# Patient Record
Sex: Female | Born: 1995 | Race: White | Hispanic: No | Marital: Single | Smoking: Never smoker
Health system: Southern US, Community
[De-identification: ages and names within clinical notes are randomized; demographics above are authoritative.]

## PROBLEM LIST (undated history)

## (undated) DIAGNOSIS — F429 Obsessive-compulsive disorder, unspecified: Secondary | ICD-10-CM

## (undated) DIAGNOSIS — F419 Anxiety disorder, unspecified: Secondary | ICD-10-CM

## (undated) DIAGNOSIS — F32A Depression, unspecified: Secondary | ICD-10-CM

## (undated) HISTORY — DX: Depression, unspecified: F32.A

## (undated) HISTORY — PX: WISDOM TOOTH EXTRACTION: SHX21

## (undated) HISTORY — DX: Obsessive-compulsive disorder, unspecified: F42.9

## (undated) HISTORY — DX: Anxiety disorder, unspecified: F41.9

---

## 2016-04-10 ENCOUNTER — Emergency Department (HOSPITAL_BASED_OUTPATIENT_CLINIC_OR_DEPARTMENT_OTHER)
Admission: EM | Admit: 2016-04-10 | Discharge: 2016-04-10 | Disposition: A | Discharge status: Home / Self Care | Payer: BLUE CROSS/BLUE SHIELD | Attending: Emergency Medicine | Admitting: Emergency Medicine

## 2016-04-10 ENCOUNTER — Encounter (HOSPITAL_BASED_OUTPATIENT_CLINIC_OR_DEPARTMENT_OTHER): Payer: Self-pay | Admitting: Emergency Medicine

## 2016-04-10 ENCOUNTER — Emergency Department (HOSPITAL_BASED_OUTPATIENT_CLINIC_OR_DEPARTMENT_OTHER): Payer: BLUE CROSS/BLUE SHIELD

## 2016-04-10 DIAGNOSIS — Y92009 Unspecified place in unspecified non-institutional (private) residence as the place of occurrence of the external cause: Secondary | ICD-10-CM | POA: Diagnosis not present

## 2016-04-10 DIAGNOSIS — X501XXA Overexertion from prolonged static or awkward postures, initial encounter: Secondary | ICD-10-CM | POA: Diagnosis not present

## 2016-04-10 DIAGNOSIS — Y939 Activity, unspecified: Secondary | ICD-10-CM | POA: Insufficient documentation

## 2016-04-10 DIAGNOSIS — Y999 Unspecified external cause status: Secondary | ICD-10-CM | POA: Insufficient documentation

## 2016-04-10 DIAGNOSIS — S93601A Unspecified sprain of right foot, initial encounter: Secondary | ICD-10-CM | POA: Diagnosis not present

## 2016-04-10 DIAGNOSIS — S99921A Unspecified injury of right foot, initial encounter: Secondary | ICD-10-CM | POA: Diagnosis present

## 2016-04-10 NOTE — ED Triage Notes (Signed)
Pt states she was getting up from sitting and her right foot was "asleep" and she stepped down, rolling her right ankle, then in response to the ankle pain, lifted her foot up into the edge of the ottoman injuring upper right foot.  Pt c/o right foot and ankle pain since.

## 2016-04-10 NOTE — Discharge Instructions (Signed)
Rest.  Ice for 20 minutes every two hours while awake for the next two days.  Elevate your foot as much as possible for the next 2 days.  Follow up with your primary doctor if you are not improving in the next week.

## 2016-04-10 NOTE — ED Notes (Signed)
ED Provider at bedside. 

## 2016-04-10 NOTE — ED Notes (Signed)
Family at bedside. 

## 2016-04-10 NOTE — ED Notes (Addendum)
Patient reports stepping down when her foot was asleep, this caused pain, in reaction she moved her foot suddenly striking the wooden edge of an ottoman, patient states she then stepped down again causing worse pain.   Bruising noted to right lateral ankle, right lateral side of foot. Patient states pain is worst at lateral side of foot. Ice applied in triage. Patient has not taken anything for pain PTA.

## 2016-04-10 NOTE — ED Provider Notes (Signed)
[facility] Note   CSN: [id number] Arrival date & time: 04/10/16  1427  By signing my name below, I, Catherine Salas, attest that this documentation has been prepared under the direction and in the presence of physician practitioner, Catherine Lyonsouglas Sundance Moise, MD. Electronically Signed: Linna Darnerussell Salas, Scribe. 04/10/2016. 3:20 PM.  History   Chief Complaint Chief Complaint  Patient presents with  . Foot Pain    The history is provided by the patient. No language interpreter was used.     HPI Comments: Catherine Salas is a 21 y.o. female who presents to the Emergency Department complaining of sudden onset, constant, right lateral foot pain beginning a few hours ago. She reports associated swelling. Pt was in a sitting position at home and states her right foot was tingling and "asleep." Pt states she stood up and inverted her right foot which caused significant pain. She reports severe pain exacerbation with bearing weight on her right foot and ambulating. Pt notes the numbness/tingling she experienced initially has now resolved. She denies focal weakness or any other associated symptoms.  History reviewed. No pertinent past medical history.  There are no active problems to display for this patient.   History reviewed. No pertinent surgical history.  OB History    No data available       Home Medications    Prior to Admission medications   Medication Sig Start Date End Date Taking? Authorizing Provider  Norgestim-Eth Estrad Triphasic (TRI-LO-MARZIA PO) Take by mouth daily.   Yes Historical Provider, MD    Family History No family history on file.  Social History Social History  Substance Use Topics  . Smoking status: Never Smoker  . Smokeless tobacco: Never Used  . Alcohol use No     Allergies   Amoxicillin   Review of Systems Review of Systems  Musculoskeletal: Positive for arthralgias and joint swelling.  Neurological: Positive for numbness  (resolved). Negative for weakness.  All other systems reviewed and are negative.  Physical Exam Updated Vital Signs BP 137/79 (BP Location: Right Arm)   Pulse (!) 102   Temp 98.4 F (36.9 C) (Oral)   Resp 20   Ht 5\' 7"  (1.702 m)   Wt 200 lb (90.7 kg)   LMP 03/20/2016 (Exact Date)   SpO2 100%   BMI 31.32 kg/m   Physical Exam  Constitutional: She is oriented to person, place, and time. She appears well-developed and well-nourished.  HENT:  Head: Normocephalic.  Eyes: EOM are normal.  Neck: Normal range of motion.  Pulmonary/Chest: Effort normal.  Abdominal: She exhibits no distension.  Musculoskeletal: Normal range of motion.  Mild swelling and TTP over the proximal dorsum of the right foot. Mild tenderness in the soft tissue below the lateral malleolus. Foot is PMS intact.  Neurological: She is alert and oriented to person, place, and time.  Psychiatric: She has a normal mood and affect.  Nursing note and vitals reviewed.  ED Treatments / Results  Labs (all labs ordered are listed, but only abnormal results are displayed) Labs Reviewed - No data to display  EKG  EKG Interpretation None       Radiology No results found.  Procedures Procedures (including critical care time)  DIAGNOSTIC STUDIES: Oxygen Saturation is 100% on RA, normal by my interpretation.    COORDINATION OF CARE: 3:24 PM Discussed treatment plan with pt at bedside and pt agreed to plan.  Medications Ordered in ED Medications - No data to display   Initial  Impression / Assessment and Plan / ED Course  I have reviewed the triage vital signs and the nursing notes.  Pertinent labs & imaging results that were available during my care of the patient were reviewed by me and considered in my medical decision making (see chart for details).  X-rays are negative. This will be treated as a sprain with an Ace bandage, rest, elevation, ice, and follow-up as needed if not improving.  Final Clinical  Impressions(s) / ED Diagnoses   Final diagnoses:  None    New Prescriptions New Prescriptions   No medications on file   I personally performed the services described in this documentation, which was scribed in my presence. The recorded information has been reviewed and is accurate.       Catherine Lyons, MD 04/10/16 [phone]

## 2016-04-10 NOTE — ED Notes (Signed)
Pt given d/c instructions as per chart. Verbalizes understanding. No questions. 

## 2019-02-08 ENCOUNTER — Ambulatory Visit: Payer: Self-pay | Attending: Pediatrics

## 2019-02-08 DIAGNOSIS — Z23 Encounter for immunization: Secondary | ICD-10-CM | POA: Insufficient documentation

## 2019-02-08 NOTE — Progress Notes (Signed)
   Covid-19 Vaccination Clinic  Name:  CARLEAN CROWL    MRN: [id number] DOB: Jul 25, 1995  02/08/2019  Ms. Klepper was observed post Covid-19 immunization for 15 minutes without incidence. She was provided with Vaccine Information Sheet and instruction to access the V-Safe system.   Ms. Casalino was instructed to call 911 with any severe reactions post vaccine: Marland Kitchen Difficulty breathing  . Swelling of your face and throat  . A fast heartbeat  . A bad rash all over your body  . Dizziness and weakness    Immunizations Administered    Name Date Dose VIS Date Route   Pfizer COVID-19 Vaccine 02/08/2019  4:15 PM 0.3 mL 12/29/2018 Intramuscular   Manufacturer: [employer], [employer]   Lot: [id number]   NDC: [address]-1

## 2019-02-28 ENCOUNTER — Ambulatory Visit: Payer: Self-pay | Attending: Internal Medicine

## 2019-02-28 DIAGNOSIS — Z23 Encounter for immunization: Secondary | ICD-10-CM | POA: Insufficient documentation

## 2019-02-28 NOTE — Progress Notes (Signed)
   Covid-19 Vaccination Clinic  Name:  MAEGAN BULLER    MRN: [id number] DOB: 04-25-1995  02/28/2019  Ms. Randleman was observed post Covid-19 immunization for 15 minutes without incidence. She was provided with Vaccine Information Sheet and instruction to access the V-Safe system.   Ms. Vukelich was instructed to call 911 with any severe reactions post vaccine: Marland Kitchen Difficulty breathing  . Swelling of your face and throat  . A fast heartbeat  . A bad rash all over your body  . Dizziness and weakness    Immunizations Administered    Name Date Dose VIS Date Route   Pfizer COVID-19 Vaccine 02/28/2019 10:55 AM 0.3 mL 12/29/2018 Intramuscular   Manufacturer: [employer], [employer]   Lot: [id number]   NDC: [address]-9

## 2020-11-11 ENCOUNTER — Ambulatory Visit: Payer: 59 | Admitting: Orthopaedic Surgery

## 2020-11-11 ENCOUNTER — Ambulatory Visit: Payer: Self-pay

## 2020-11-11 ENCOUNTER — Other Ambulatory Visit: Payer: Self-pay

## 2020-11-11 DIAGNOSIS — G8929 Other chronic pain: Secondary | ICD-10-CM | POA: Diagnosis not present

## 2020-11-11 DIAGNOSIS — M25561 Pain in right knee: Secondary | ICD-10-CM | POA: Diagnosis not present

## 2020-11-11 NOTE — Progress Notes (Signed)
Office Visit Note   Patient: Catherine Salas           Date of Birth: [birth year]           MRN: [id number] Visit Date: 11/11/2020              Requested by: Roger Kill, MD [address] [place],  [place] [address] PCP: Roger Kill, MD   Assessment & Plan: Visit Diagnoses:  1. Chronic pain of right knee     Plan: Impression is acute right knee injury.  Given prior history patellar dislocation and chondral injury and with present symptoms of locking we will need to obtain MRI to rule out structural abnormalities and to look at chondral injury.  In the meantime we will place her in a PSO brace.  Follow-up after the MRI.  Follow-Up Instructions: No follow-ups on file.   Orders:  Orders Placed This Encounter  Procedures   XR KNEE 3 VIEW RIGHT   MR Knee Right w/o contrast   No orders of the defined types were placed in this encounter.     Procedures: No procedures performed   Clinical Data: No additional findings.   Subjective: Chief Complaint  Patient presents with   Right Knee - Pain    [name] is a very pleasant 25 year old female here for evaluation of acute right knee injury from this Friday.  She planted and pivoted her right leg and felt immediate pain to the medial side of the knee.  She has had prior episode of patellar dislocation which she states on subsequent MRI showed chondral injury and meniscal tear.  She feels some slight swelling in the knee.  Feels pain on the medial side and has locking symptoms.   Review of Systems  Constitutional: Negative.   HENT: Negative.    Eyes: Negative.   Respiratory: Negative.    Cardiovascular: Negative.   Endocrine: Negative.   Musculoskeletal: Negative.   Neurological: Negative.   Hematological: Negative.   Psychiatric/Behavioral: Negative.    All other systems reviewed and are negative.   Objective: Vital Signs: There were no vitals taken for this visit.  Physical Exam Vitals and  nursing note reviewed.  Constitutional:      Appearance: She is well-developed.  Pulmonary:     Effort: Pulmonary effort is normal.  Skin:    General: Skin is warm.     Capillary Refill: Capillary refill takes less than 2 seconds.  Neurological:     Mental Status: She is alert and oriented to person, place, and time.  Psychiatric:        Behavior: Behavior normal.        Thought Content: Thought content normal.        Judgment: Judgment normal.    Ortho Exam  Right knee shows trace effusion.  Positive patellar apprehension.  MCL feels stable.  There is medial joint line tenderness.  Range of motion is limited in terms of full extension due to locking sensation.  Specialty Comments:  No specialty comments available.  Imaging: XR KNEE 3 VIEW RIGHT  Result Date: 11/11/2020 No acute or structural abnormalities    PMFS History: There are no problems to display for this patient.  No past medical history on file.  No family history on file.  No past surgical history on file. Social History   Occupational History   Not on file  Tobacco Use   Smoking status: Never   Smokeless tobacco: Never  Substance and Sexual Activity   Alcohol use: No   Drug use: Not on file   Sexual activity: Not on file

## 2020-11-17 ENCOUNTER — Telehealth: Payer: Self-pay | Admitting: Orthopaedic Surgery

## 2020-11-17 NOTE — Telephone Encounter (Signed)
Patient called. She would like another knee brace, says the one she has will not stay on her knee. Her call back number is [phone]

## 2020-11-18 ENCOUNTER — Telehealth: Payer: Self-pay | Admitting: Orthopaedic Surgery

## 2020-11-18 NOTE — Telephone Encounter (Signed)
Hinged knee brace I guess

## 2020-11-18 NOTE — Telephone Encounter (Signed)
Error

## 2020-11-18 NOTE — Telephone Encounter (Signed)
Looks like she has a PSO brace. Would you like to give her another style?

## 2020-11-18 NOTE — Telephone Encounter (Signed)
Message already routed to Dr Roda Shutters.

## 2020-11-18 NOTE — Telephone Encounter (Signed)
Pt called again

## 2020-11-19 ENCOUNTER — Other Ambulatory Visit: Payer: Self-pay

## 2020-11-19 ENCOUNTER — Ambulatory Visit (INDEPENDENT_AMBULATORY_CARE_PROVIDER_SITE_OTHER): Payer: 59

## 2020-11-19 DIAGNOSIS — M25561 Pain in right knee: Secondary | ICD-10-CM

## 2020-11-19 NOTE — Telephone Encounter (Signed)
Will come in to get fitted for brace.

## 2020-11-19 NOTE — Progress Notes (Signed)
Pt was fitted and given right hinged knee brace

## 2020-11-22 ENCOUNTER — Ambulatory Visit
Admission: RE | Admit: 2020-11-22 | Discharge: 2020-11-22 | Disposition: A | Discharge status: Home / Self Care | Payer: 59 | Source: Ambulatory Visit | Attending: Orthopaedic Surgery | Admitting: Orthopaedic Surgery

## 2020-11-22 DIAGNOSIS — G8929 Other chronic pain: Secondary | ICD-10-CM

## 2020-11-24 ENCOUNTER — Telehealth: Payer: Self-pay | Admitting: Orthopaedic Surgery

## 2020-11-24 NOTE — Telephone Encounter (Signed)
Pt called and would like to speak with someone about her MRI results. She leaves to go out of town wed and will not be able to speak with anyone.  CB [phone]

## 2020-11-24 NOTE — Telephone Encounter (Signed)
Left voice mail

## 2020-11-24 NOTE — Telephone Encounter (Signed)
Called pt 1X and left vm for pt to set appt with Dr. Roda Shutters for MRI results after 11/24/20

## 2020-11-25 ENCOUNTER — Other Ambulatory Visit: Payer: 59

## 2020-11-25 ENCOUNTER — Ambulatory Visit: Payer: 59 | Admitting: Orthopaedic Surgery

## 2020-12-02 ENCOUNTER — Telehealth: Payer: Self-pay | Admitting: Orthopaedic Surgery

## 2020-12-02 NOTE — Telephone Encounter (Signed)
Called pt left vm to set appt with DR. Xu for MRI review.

## 2020-12-15 ENCOUNTER — Telehealth: Payer: Self-pay | Admitting: Orthopaedic Surgery

## 2020-12-15 NOTE — Telephone Encounter (Signed)
Called pt left vm to call set appt with DR. Xu for MRI review.

## 2021-03-24 ENCOUNTER — Ambulatory Visit: Payer: 59 | Admitting: Physician Assistant

## 2022-05-31 ENCOUNTER — Telehealth (HOSPITAL_COMMUNITY): Payer: Self-pay | Admitting: Licensed Clinical Social Worker

## 2022-06-02 ENCOUNTER — Telehealth (HOSPITAL_COMMUNITY): Payer: Self-pay | Admitting: Licensed Clinical Social Worker

## 2022-06-03 ENCOUNTER — Other Ambulatory Visit (HOSPITAL_COMMUNITY): Payer: Self-pay

## 2022-06-07 ENCOUNTER — Other Ambulatory Visit (HOSPITAL_COMMUNITY): Payer: Self-pay | Attending: Psychiatry | Admitting: Professional

## 2022-06-07 DIAGNOSIS — F332 Major depressive disorder, recurrent severe without psychotic features: Secondary | ICD-10-CM | POA: Insufficient documentation

## 2022-06-07 DIAGNOSIS — F429 Obsessive-compulsive disorder, unspecified: Secondary | ICD-10-CM | POA: Insufficient documentation

## 2022-06-07 DIAGNOSIS — F422 Mixed obsessional thoughts and acts: Secondary | ICD-10-CM
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
## 2022-06-10 ENCOUNTER — Other Ambulatory Visit (HOSPITAL_COMMUNITY): Payer: 59

## 2022-06-10 ENCOUNTER — Other Ambulatory Visit (HOSPITAL_COMMUNITY): Payer: 59 | Attending: Psychiatry | Admitting: Licensed Clinical Social Worker

## 2022-06-10 DIAGNOSIS — F4310 Post-traumatic stress disorder, unspecified: Secondary | ICD-10-CM | POA: Insufficient documentation

## 2022-06-10 DIAGNOSIS — F422 Mixed obsessional thoughts and acts: Secondary | ICD-10-CM | POA: Insufficient documentation

## 2022-06-10 DIAGNOSIS — Z79899 Other long term (current) drug therapy: Secondary | ICD-10-CM | POA: Insufficient documentation

## 2022-06-10 DIAGNOSIS — F411 Generalized anxiety disorder: Secondary | ICD-10-CM | POA: Diagnosis not present

## 2022-06-10 DIAGNOSIS — F429 Obsessive-compulsive disorder, unspecified: Secondary | ICD-10-CM | POA: Diagnosis not present

## 2022-06-10 DIAGNOSIS — F332 Major depressive disorder, recurrent severe without psychotic features: Secondary | ICD-10-CM | POA: Insufficient documentation

## 2022-06-10 DIAGNOSIS — F909 Attention-deficit hyperactivity disorder, unspecified type: Secondary | ICD-10-CM | POA: Diagnosis not present

## 2022-06-10 DIAGNOSIS — G4700 Insomnia, unspecified: Secondary | ICD-10-CM | POA: Insufficient documentation

## 2022-06-10 DIAGNOSIS — Z62810 Personal history of physical and sexual abuse in childhood: Secondary | ICD-10-CM | POA: Diagnosis not present

## 2022-06-10 DIAGNOSIS — R4589 Other symptoms and signs involving emotional state: Secondary | ICD-10-CM | POA: Insufficient documentation

## 2022-06-10 MED ORDER — HYDROXYZINE HCL 10 MG PO TABS: 10.0000 mg | ORAL_TABLET | Freq: Three times a day (TID) | ORAL | 0 refills | Status: DC | PRN
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
## 2022-06-11 ENCOUNTER — Other Ambulatory Visit (HOSPITAL_COMMUNITY): Payer: 59 | Admitting: Licensed Clinical Social Worker

## 2022-06-11 ENCOUNTER — Encounter (HOSPITAL_COMMUNITY): Payer: Self-pay

## 2022-06-11 ENCOUNTER — Other Ambulatory Visit (HOSPITAL_COMMUNITY): Payer: 59

## 2022-06-11 DIAGNOSIS — R4589 Other symptoms and signs involving emotional state: Secondary | ICD-10-CM

## 2022-06-11 DIAGNOSIS — F411 Generalized anxiety disorder: Secondary | ICD-10-CM | POA: Diagnosis not present

## 2022-06-11 DIAGNOSIS — F332 Major depressive disorder, recurrent severe without psychotic features: Secondary | ICD-10-CM

## 2022-06-11 NOTE — Therapy (Signed)
[facility] PARTIAL HOSPITALIZATION PROGRAM [address] [place], [place], [address] Phone: [phone]   Fax:  [phone]  Occupational Therapy Treatment Virtual Visit via Video Note  I connected with Catherine Salas on 06/11/22 at  8:00 AM EDT by a video enabled telemedicine application and verified that I am speaking with the correct person using two identifiers.  Location: Patient: home Provider: office   I discussed the limitations of evaluation and management by telemedicine and the availability of in person appointments. The patient expressed understanding and agreed to proceed.    The patient was advised to call back or seek an in-person evaluation if the symptoms worsen or if the condition fails to improve as anticipated.  I provided 55 minutes of non-face-to-face time during this encounter.   Patient Details  Name: Catherine Salas MRN: [id number] Date of Birth: 01/26/1995 No data recorded  Encounter Date: 06/11/2022   OT End of Session - 06/11/22 1804     Visit Number 2    Number of Visits 20    Date for OT Re-Evaluation 07/11/22    OT Start Time 1200    OT Stop Time 1255    OT Time Calculation (min) 55 min             History reviewed. No pertinent past medical history.  History reviewed. No pertinent surgical history.  There were no vitals filed for this visit.   Subjective Assessment - 06/11/22 1803     Currently in Pain? No/denies    Pain Score 0-No pain                Group Session:  S: Feeling better today.   O: During today's OT group session, the patient participated in an educational segment about the importance of goal-setting and the application of the SMART framework to enhance daily life, particularly focusing on ADLs and iADLs. The session began with five open-ended pre-session questions that facilitated group discussion and introspection about their current relationship with goals. Following the  introduction and educational segment, participants engaged in brainstorming and group discussions to devise hypothetical SMART goals. The session concluded with five post-session questions to reinforce understanding and facilitate reflection. Throughout the session, there was a range of engagement levels noted among the participants.   A:  Patient demonstrated a high level of engagement throughout the session. They actively participated in discussions, sharing personal experiences related to goal setting and challenges faced. Patient was able to clearly articulate an understanding of the SMART framework and proposed personal SMART goals related to their own ADLs with minimal assistance. They expressed enthusiasm about applying what they learned to their daily routine and appeared motivated to make changes.   P: Continue to attend PHP OT group sessions 5x week for 4 weeks to promote daily structure, social engagement, and opportunities to develop and utilize adaptive strategies to maximize functional performance in preparation for safe transition and integration back into school, work, and the community. Plan to address topic of Routines 1 in next OT group session.                   OT Education - 06/11/22 1803     Education Details SMART Goals 4              OT Short Term Goals - 06/11/22 1754       OT SHORT TERM GOAL #1   Title Client will develop and utilize a personalized coping  toolbox containing at least five coping strategies to manage challenging situations, demonstrating their use in real-life scenarios by the end of therapy.    Time 4    Period Weeks    Status On-going    Target Date 07/11/22      OT SHORT TERM GOAL #2   Title Client will set aside designated times for self-care activities (e.g., reading, meditation, exercise) and consistently engage in these activities 3 times a week.    Status On-going      OT SHORT TERM GOAL #3   Title By discharge,  client will demonstrate the ability to adapt and modify routines when faced with unexpected events or changes, maintaining a balanced approach to daily tasks.    Status On-going      OT SHORT TERM GOAL #4   Title By the time of discharge, client will independently set, track, and make progress towards a long-term goal, demonstrating resilience in overcoming obstacles and seeking support when needed.    Status On-going                      Plan - 06/11/22 1804     Psychosocial Skills Habits;Coping Strategies;Interpersonal Interaction;Routines and Behaviors             Patient will benefit from skilled therapeutic intervention in order to improve the following deficits and impairments:       Psychosocial Skills: Habits, Coping Strategies, Interpersonal Interaction, Routines and Behaviors   Visit Diagnosis: Difficulty coping    Problem List Patient Active Problem List   Diagnosis Date Noted   MDD (major depressive disorder), recurrent episode, severe (HCC) 06/07/2022   OCD (obsessive compulsive disorder) 06/07/2022    [name], OT 06/11/2022, 6:04 PM  [name], OT   [facility] HOSPITALIZATION PROGRAM [address] [place], [place], [address] Phone: [phone]   Fax:  [phone]  Name: Catherine Salas MRN: [id number] Date of Birth: 01-11-1996

## 2022-06-11 NOTE — Therapy (Signed)
[facility] PARTIAL HOSPITALIZATION PROGRAM [address] [place], [place], [address] Phone: [phone]   Fax:  [phone]  Occupational Therapy Evaluation Virtual Visit via Video Note  I connected with Catherine Salas on 06/11/22 at  8:00 AM EDT by a video enabled telemedicine application and verified that I am speaking with the correct person using two identifiers.  Location: Patient: home Provider: office   I discussed the limitations of evaluation and management by telemedicine and the availability of in person appointments. The patient expressed understanding and agreed to proceed.    The patient was advised to call back or seek an in-person evaluation if the symptoms worsen or if the condition fails to improve as anticipated.  I provided 85 minutes of non-face-to-face time during this encounter.   Patient Details  Name: Catherine Salas MRN: [id number] Date of Birth: 1995/04/16 No data recorded  Encounter Date: 06/10/2022   OT End of Session - 06/11/22 1752     Visit Number 1    Number of Visits 20    Date for OT Re-Evaluation 07/11/22    OT Start Time 0930    OT Stop Time 1255   eval: 30; Tx: 55   OT Time Calculation (min) 205 min    Activity Tolerance Patient tolerated treatment well             History reviewed. No pertinent past medical history.  History reviewed. No pertinent surgical history.  There were no vitals filed for this visit.   Subjective Assessment - 06/11/22 1749     Subjective  I am hoping to learn now coping skills while I am here.    Pertinent History MDD, OCD    Limitations roles, interests, goals, communication and coping skills    Currently in Pain? No/denies    Pain Score 0-No pain                   OT Assessment  Diagnosis: MDD, OCD Past medical history/referral information: MDD Living situation: parents ADLs: independent Work: non-profit Leisure: inhibited  Social support:   intact Struggles: coping, communication, interests, roles, habits, routines and goals OT goal:  Improve deficit areas to allow for independence in daily occupational performance.   OCAIRS Mental Health Interview Summary of Client Scores:  Facilitates participation in occupation Allows participation in occupation Inhibits participation in occupation Restricts participation in occupation Comments:  Roles   X    Habits    X   Personal Causation   X    Values X      Interests  X     Skills   X    Short-Term Goals  X     Long-term Goals   X    Interpretation of Past Experiences   X    Physical Environment   X    Social Environment  X     Readiness for Change   X      Need for Occupational Therapy:  4 Shows positive occupational participation, no need for OT.   3 Need for minimal intervention/consultative participation   2 Need for OT intervention indicated to restore/improve participation   1 Need for extensive OT intervention indicated to improve participation.  Referral for follow up services also recommended.   Assessment:  Patient demonstrates behavior that INHIBITS participation in occupation.  Patient will benefit from occupational therapy intervention in order to improve time management, financial management, stress management, job readiness skills, social skills, and  health management skills in preparation to return to full time community living and to be a productive community member.    Plan:  Patient will participate in skilled occupational therapy sessions individually or in a group setting to improve coping skills, psychosocial skills, and emotional skills required to return to prior level of function. Treatment will be 4-5 times per week for 4 weeks.      Group Session:  O: During today's OT group session, the patient participated in an educational segment about the importance of goal-setting and the application of the SMART framework to enhance daily life, particularly  focusing on ADLs and iADLs. The session began with five open-ended pre-session questions that facilitated group discussion and introspection about their current relationship with goals. Following the introduction and educational segment, participants engaged in brainstorming and group discussions to devise hypothetical SMART goals. The session concluded with five post-session questions to reinforce understanding and facilitate reflection. Throughout the session, there was a range of engagement levels noted among the participants.   A:  Patient demonstrated a high level of engagement throughout the session. They actively participated in discussions, sharing personal experiences related to goal setting and challenges faced. Patient was able to clearly articulate an understanding of the SMART framework and proposed personal SMART goals related to their own ADLs with minimal assistance. They expressed enthusiasm about applying what they learned to their daily routine and appeared motivated to make changes.              OT Education - 06/11/22 1751     Education Details OCAIRS / SMART Goals 3    Person(s) Educated Patient    Methods Explanation;Verbal cues    Comprehension Verbalized understanding              OT Short Term Goals - 06/11/22 1754       OT SHORT TERM GOAL #1   Title Client will develop and utilize a personalized coping toolbox containing at least five coping strategies to manage challenging situations, demonstrating their use in real-life scenarios by the end of therapy.    Time 4    Period Weeks    Status On-going    Target Date 07/11/22      OT SHORT TERM GOAL #2   Title Client will set aside designated times for self-care activities (e.g., reading, meditation, exercise) and consistently engage in these activities 3 times a week.    Status On-going      OT SHORT TERM GOAL #3   Title By discharge, client will demonstrate the ability to adapt and modify routines  when faced with unexpected events or changes, maintaining a balanced approach to daily tasks.    Status On-going      OT SHORT TERM GOAL #4   Title By the time of discharge, client will independently set, track, and make progress towards a long-term goal, demonstrating resilience in overcoming obstacles and seeking support when needed.    Status On-going                      Plan - 06/11/22 1753     Clinical Impression Statement Pt presents with deficits across multiple psychosocial domains that inhibit occupational performance.    OT Occupational Profile and History Problem Focused Assessment - Including review of records relating to presenting problem    Occupational performance deficits (Please refer to evaluation for details): Rest and Sleep;Work;Leisure;Social Participation    Psychosocial Skills Habits;Coping Strategies;Interpersonal Interaction;Routines and Behaviors  Rehab Potential Good    Clinical Decision Making Limited treatment options, no task modification necessary    Comorbidities Affecting Occupational Performance: None    Modification or Assistance to Complete Evaluation  No modification of tasks or assist necessary to complete eval    OT Frequency 5x / week    OT Duration 4 weeks    OT Treatment/Interventions Psychosocial skills training;Coping strategies training             Patient will benefit from skilled therapeutic intervention in order to improve the following deficits and impairments:       Psychosocial Skills: Habits, Coping Strategies, Interpersonal Interaction, Routines and Behaviors   Visit Diagnosis: Difficulty coping  Severe episode of recurrent major depressive disorder, without psychotic features (HCC)  Mixed obsessional thoughts and acts    Problem List Patient Active Problem List   Diagnosis Date Noted   MDD (major depressive disorder), recurrent episode, severe (HCC) 06/07/2022   OCD (obsessive compulsive disorder)  06/07/2022    [name], OT 06/11/2022, 5:56 PM  [name], OT   [facility] [place] [facility] HOSPITALIZATION PROGRAM [address] [place], [place], [address] Phone: [phone]   Fax:  [phone]  Name: Catherine Salas MRN: [id number] Date of Birth: 09/06/1995

## 2022-06-15 ENCOUNTER — Other Ambulatory Visit (HOSPITAL_COMMUNITY): Payer: 59

## 2022-06-15 ENCOUNTER — Other Ambulatory Visit (HOSPITAL_COMMUNITY): Payer: 59 | Admitting: Licensed Clinical Social Worker

## 2022-06-15 ENCOUNTER — Encounter (HOSPITAL_COMMUNITY): Payer: Self-pay | Admitting: Family

## 2022-06-15 ENCOUNTER — Encounter (HOSPITAL_COMMUNITY): Payer: Self-pay

## 2022-06-15 DIAGNOSIS — R4589 Other symptoms and signs involving emotional state: Secondary | ICD-10-CM

## 2022-06-15 DIAGNOSIS — F332 Major depressive disorder, recurrent severe without psychotic features: Secondary | ICD-10-CM

## 2022-06-15 DIAGNOSIS — F411 Generalized anxiety disorder: Secondary | ICD-10-CM | POA: Diagnosis not present

## 2022-06-15 DIAGNOSIS — F422 Mixed obsessional thoughts and acts: Secondary | ICD-10-CM

## 2022-06-15 MED ORDER — FLUOXETINE HCL 10 MG PO CAPS: 30.0000 mg | ORAL_CAPSULE | Freq: Every day | ORAL | 0 refills | Status: DC

## 2022-06-15 NOTE — Progress Notes (Signed)
Virtual Visit via Video Note  I connected with Catherine Salas on 06/15/22 at  9:00 AM EDT by a video enabled telemedicine application and verified that I am speaking with the correct person using two identifiers.  Location: Patient: Home Provider: Office   I discussed the limitations of evaluation and management by telemedicine and the availability of in person appointments. The patient expressed understanding and agreed to proceed.    I discussed the assessment and treatment plan with the patient. The patient was provided an opportunity to ask questions and all were answered. The patient agreed with the plan and demonstrated an understanding of the instructions.   The patient was advised to call back or seek an in-person evaluation if the symptoms worsen or if the condition fails to improve as anticipated.  I provided 15 minutes of non-face-to-face time during this encounter.   Catherine Rack, NP   [facility] MD/PA/NP OP Progress Note  06/15/2022 11:28 AM Catherine Salas  MRN:  [id number]  Chief Complaint: " I am doing okay today, I does have acute questions related to my medications."  Evaluation: Catherine Salas was seen and evaluated via video assessment.  She is awake, alert and oriented x 3.  Denying suicidal or homicidal ideations.  Denies auditory  or visual hallucinations.  She reports worsening depression and anxiety.  States she is diagnosed with obsessive-compulsive disorder which is worse since becoming difficult for her to function.  States this is causing multiple stressors throughout her life over the past 6 months.  States she has not been able to cope well.    States feeling overwhelmed at times.  Patient reports she was recently initiated on Prozac 20 mg.  Discussed titrating to 30 mg during this assessment.  She was receptive to plan.  Does report concerns related to appetite however she attributes her decreased appetite to her ADHD medication.  States she has to force  herself to eat as she is currently prescribed Ritalin.  She denied illicit drug use or substance abuse history.  States she is resting well throughout the night.  Patient has concerns related to taking hydroxyzine for anxiety.  Patient encouraged to take medication while attending partial hospitalization programming to report any adverse reactions.  She was receptive to plan.  Support, encouragement  and reassurance was provided.  HPI: per admission assessment noted: " Tayva Marich is a 27 yo patient with a PPH of depression, anxiety, ADHD, PTSD, OCD."   Visit Diagnosis:    ICD-10-CM   1. Severe episode of recurrent major depressive disorder, without psychotic features (HCC)  F33.2     2. Mixed obsessional thoughts and acts  F42.2       Past Psychiatric History:   Past Medical History: No past medical history on file. No past surgical history on file.  Family Psychiatric History:   Family History: No family history on file.  Social History:  Social History   Socioeconomic History   Marital status: Single    Spouse name: Not on file   Number of children: Not on file   Years of education: Not on file   Highest education level: Not on file  Occupational History   Not on file  Tobacco Use   Smoking status: Never   Smokeless tobacco: Never  Substance and Sexual Activity   Alcohol use: No   Drug use: Not on file   Sexual activity: Not on file  Other Topics Concern   Not on file  Social  History Narrative   Not on file   Social Determinants of Health   Financial Resource Strain: Not on file  Food Insecurity: Not on file  Transportation Needs: Not on file  Physical Activity: Not on file  Stress: Not on file  Social Connections: Not on file    Allergies:  Allergies  Allergen Reactions   Amoxicillin Hives    Metabolic Disorder Labs: No results found for: "HGBA1C", "MPG" No results found for: "PROLACTIN" No results found for: "CHOL", "TRIG", "HDL", "CHOLHDL",  "VLDL", "LDLCALC" No results found for: "TSH"  Therapeutic Level Labs: No results found for: "LITHIUM" No results found for: "VALPROATE" No results found for: "CBMZ"  Current Medications: Current Outpatient Medications  Medication Sig Dispense Refill   hydrOXYzine (ATARAX) 10 MG tablet Take 1 tablet (10 mg total) by mouth 3 (three) times daily as needed. 90 tablet 0   Norgestim-Eth Estrad Triphasic (TRI-LO-MARZIA PO) Take by mouth daily.     No current facility-administered medications for this visit.     Musculoskeletal: Strength & Muscle Tone: within normal limits Gait & Station: normal Patient leans: N/A  Psychiatric Specialty Exam: Review of Systems  Constitutional: Negative.   Psychiatric/Behavioral:  Positive for decreased concentration. The patient is nervous/anxious.   All other systems reviewed and are negative.   There were no vitals taken for this visit.There is no height or weight on file to calculate BMI.  General Appearance: Casual  Eye Contact:  Good  Speech:  Clear and Coherent  Volume:  Normal  Mood:  Anxious and Depressed  Affect:  Congruent  Thought Process:  Coherent  Orientation:  Full (Time, Place, and Person)  Thought Content: Logical   Suicidal Thoughts:  No  Homicidal Thoughts:  No  Memory:  Immediate;   Fair Recent;   Good  Judgement:  Good  Insight:  Good  Psychomotor Activity:  Normal  Concentration:  Concentration: Good  Recall:  Good  Fund of Knowledge: Good  Language: Good  Akathisia:  No  Handed:  Right  AIMS (if indicated): done  Assets:  Communication Skills Desire for Improvement  ADL's:  Intact  Cognition: WNL  Sleep:  Good   Screenings: PHQ2-9    Flowsheet Row Counselor from 06/07/2022 in [facility] PART[facility] HOSPITALIZATION PROGRAM  PHQ-2 Total Score 4  PHQ-9 Total Score 22      Flowsheet Row Counselor from 06/07/2022 in BEHAVIORAL HEALTH PARTIAL HOSPITALIZATION PROGRAM  C-SSRS RISK CATEGORY No Risk         Assessment and Plan:  Continue partial hospitalization programming -Increase Prozac 20 mg to 30 mg daily- resent medication to [facility] per patient request on 06/17/2022 -Continue hydroxyzine 25 mg p.o. as needed  Collaboration of Care: Collaboration of Care: Medication Management AEB increase Prozac from 20 mg to 30 mg daily  Patient/Guardian was advised Release of Information must be obtained prior to any record release in order to collaborate their care with an outside provider. Patient/Guardian was advised if they have not already done so to contact the registration department to sign all necessary forms in order for [place] to release information regarding their care.   Consent: Patient/Guardian gives verbal consent for treatment and assignment of benefits for services provided during this visit. Patient/Guardian expressed understanding and agreed to proceed.    Catherine Rack, NP 06/15/2022, 11:28 AM

## 2022-06-15 NOTE — Therapy (Signed)
[facility] PARTIAL HOSPITALIZATION PROGRAM [address] [place], [place], [address] Phone: [phone]   Fax:  [phone]  Occupational Therapy Treatment Virtual Visit via Video Note  I connected with [name] on 06/15/22 at  8:00 AM EDT by a video enabled telemedicine application and verified that I am speaking with the correct person using two identifiers.  Location: Patient: home Provider: office   I discussed the limitations of evaluation and management by telemedicine and the availability of in person appointments. The patient expressed understanding and agreed to proceed.    The patient was advised to call back or seek an in-person evaluation if the symptoms worsen or if the condition fails to improve as anticipated.  I provided 55 minutes of non-face-to-face time during this encounter.   Patient Details  Name: Catherine Salas MRN: [id number] Date of Birth: 1995-06-06 No data recorded  Encounter Date: 06/15/2022   OT End of Session - 06/15/22 1753     Visit Number 3    Number of Visits 20    Date for OT Re-Evaluation 07/11/22    OT Start Time 1200    OT Stop Time 1255    OT Time Calculation (min) 55 min             History reviewed. No pertinent past medical history.  History reviewed. No pertinent surgical history.  There were no vitals filed for this visit.   Subjective Assessment - 06/15/22 1753     Currently in Pain? No/denies    Pain Score 0-No pain                Group Session:  S: Doing okay today.   O: The objective of the telehealth group therapy session was to discuss the significance of routines in promoting mental health and wellbeing. The OT aimed to explore the power of routines in providing structure, stability, and predictability in individuals' lives, as well as their role in establishing healthy habits, reducing stress and anxiety, managing time effectively, achieving goals, and fostering  a sense of community and social connectedness.   Participants were guided to identify areas where routines could improve their mental health and were provided with tips for establishing and maintaining healthy routines. The session concluded by emphasizing the potential of routines to enhance overall quality of life.  Homework Assignment:  As part of the session, participants were assigned a homework task to reflect on their current routines and select one area of their lives where they could establish a new routine to improve their mental health and wellbeing. They were instructed to start small and implement the routine gradually, while seeking support from friends, family, or mental health professionals as needed. The participants were asked to report their progress in the next therapy session, focusing on the benefits and challenges encountered during the implementation of their chosen routine.   A: During the telehealth group therapy session, the patient actively participated in the discussion on the importance of routines in promoting mental health and wellbeing. The patient demonstrated a good understanding of the power of routines in providing structure, stability, and predictability in their life. They were able to identify areas in their life where routines could contribute to improving their overall mental health.    P: Continue to attend PHP OT group sessions 5x week for 4 weeks to promote daily structure, social engagement, and opportunities to develop and utilize adaptive strategies to maximize functional performance in preparation for safe transition  and integration back into school, work, and the community. Plan to address topic of pt 2 in next OT group session.                   OT Education - 06/15/22 1753     Education Details Routines 1              OT Short Term Goals - 06/11/22 1754       OT SHORT TERM GOAL #1   Title Client will develop and utilize a  personalized coping toolbox containing at least five coping strategies to manage challenging situations, demonstrating their use in real-life scenarios by the end of therapy.    Time 4    Period Weeks    Status On-going    Target Date 07/11/22      OT SHORT TERM GOAL #2   Title Client will set aside designated times for self-care activities (e.g., reading, meditation, exercise) and consistently engage in these activities 3 times a week.    Status On-going      OT SHORT TERM GOAL #3   Title By discharge, client will demonstrate the ability to adapt and modify routines when faced with unexpected events or changes, maintaining a balanced approach to daily tasks.    Status On-going      OT SHORT TERM GOAL #4   Title By the time of discharge, client will independently set, track, and make progress towards a long-term goal, demonstrating resilience in overcoming obstacles and seeking support when needed.    Status On-going                      Plan - 06/15/22 1753     Psychosocial Skills Habits;Coping Strategies;Interpersonal Interaction;Routines and Behaviors             Patient will benefit from skilled therapeutic intervention in order to improve the following deficits and impairments:       Psychosocial Skills: Habits, Coping Strategies, Interpersonal Interaction, Routines and Behaviors   Visit Diagnosis: Difficulty coping    Problem List Patient Active Problem List   Diagnosis Date Noted   MDD (major depressive disorder), recurrent episode, severe (HCC) 06/07/2022   OCD (obsessive compulsive disorder) 06/07/2022    [name], OT 06/15/2022, 5:54 PM  [name], OT   [facility] HOSPITALIZATION PROGRAM [address] [place], [place], [address] Phone: [phone]   Fax:  [phone]  Name: Catherine Salas MRN: [id number] Date of Birth: December 02, 1995

## 2022-06-16 ENCOUNTER — Other Ambulatory Visit (HOSPITAL_COMMUNITY): Payer: 59 | Admitting: Licensed Clinical Social Worker

## 2022-06-16 ENCOUNTER — Encounter (HOSPITAL_COMMUNITY): Payer: Self-pay

## 2022-06-16 ENCOUNTER — Other Ambulatory Visit (HOSPITAL_COMMUNITY): Payer: 59

## 2022-06-16 DIAGNOSIS — F411 Generalized anxiety disorder: Secondary | ICD-10-CM | POA: Diagnosis not present

## 2022-06-16 DIAGNOSIS — F422 Mixed obsessional thoughts and acts: Secondary | ICD-10-CM

## 2022-06-16 DIAGNOSIS — R4589 Other symptoms and signs involving emotional state: Secondary | ICD-10-CM

## 2022-06-16 DIAGNOSIS — F332 Major depressive disorder, recurrent severe without psychotic features: Secondary | ICD-10-CM

## 2022-06-16 NOTE — Progress Notes (Signed)
Spoke with patient via Teams video call, used 2 identifiers to correctly identify patient. States that she was referred to [facility] by her therapist. Has a lot of work stress working for [employer] and also moved from living with a boyfriend earlier this year. Her parents came and got her. Taking Prozac and states that it generally made her tired but lately its been having the opposite effect. Asking if she can take magnesium citrate with her medications. Message sent to MD for discussion. On scale 1-10 as 10 being worst she rates depression at 6 and anxiety at 6. Denies SI/HI or AV hallucinations.PHQ9=17. No side effects from medications. No issues or complaints.

## 2022-06-16 NOTE — Psych (Signed)
Active     OP Depression     LTG: Reduce frequency, intensity, and duration of depression symptoms so that daily functioning is improved (Initial)     Start:  06/10/22    Expected End:  09/10/22         LTG: Increase coping skills to manage depression and improve ability to perform daily activities (Initial)     Start:  06/10/22    Expected End:  09/10/22         STG: Nicole Cella will attend at least 80% of scheduled PHP sessions    (Initial)     Start:  06/10/22    Expected End:  09/10/22         STG: Nicole Cella will complete at least 80% of assigned homework  (Initial)     Start:  06/10/22    Expected End:  09/10/22         STG: Nicole Cella will identify cognitive patterns and beliefs that support depression (Initial)     Start:  06/10/22    Expected End:  09/10/22           Tema verbally agrees to treatment plan.

## 2022-06-17 ENCOUNTER — Other Ambulatory Visit (HOSPITAL_COMMUNITY): Payer: 59 | Admitting: Licensed Clinical Social Worker

## 2022-06-17 ENCOUNTER — Encounter (HOSPITAL_COMMUNITY): Payer: Self-pay

## 2022-06-17 ENCOUNTER — Other Ambulatory Visit (HOSPITAL_COMMUNITY): Payer: 59

## 2022-06-17 DIAGNOSIS — F422 Mixed obsessional thoughts and acts: Secondary | ICD-10-CM

## 2022-06-17 DIAGNOSIS — F411 Generalized anxiety disorder: Secondary | ICD-10-CM | POA: Diagnosis not present

## 2022-06-17 DIAGNOSIS — R4589 Other symptoms and signs involving emotional state: Secondary | ICD-10-CM

## 2022-06-17 DIAGNOSIS — F332 Major depressive disorder, recurrent severe without psychotic features: Secondary | ICD-10-CM

## 2022-06-17 MED ORDER — FLUOXETINE HCL 10 MG PO CAPS: 30.0000 mg | ORAL_CAPSULE | Freq: Every day | ORAL | 0 refills | Status: DC

## 2022-06-17 NOTE — Therapy (Signed)
[facility] PARTIAL HOSPITALIZATION PROGRAM [address] [place], [place], [address] Phone: [phone]   Fax:  [phone]  Occupational Therapy Treatment Virtual Visit via Video Note  I connected with [name] on 06/17/22 at  8:00 AM EDT by a video enabled telemedicine application and verified that I am speaking with the correct person using two identifiers.  Location: Patient: home Provider: office   I discussed the limitations of evaluation and management by telemedicine and the availability of in person appointments. The patient expressed understanding and agreed to proceed.    The patient was advised to call back or seek an in-person evaluation if the symptoms worsen or if the condition fails to improve as anticipated.  I provided 55 minutes of non-face-to-face time during this encounter.  Patient Details  Name: Catherine Salas MRN: [id number] Date of Birth: 1995-04-12 No data recorded  Encounter Date: 06/16/2022   OT End of Session - 06/17/22 1726     Visit Number 4    Number of Visits 20    Date for OT Re-Evaluation 07/11/22    OT Start Time 1200    OT Stop Time 1255    OT Time Calculation (min) 55 min             Past Medical History:  Diagnosis Date   Anxiety    Depression    Obsessive-compulsive disorder     Past Surgical History:  Procedure Laterality Date   WISDOM TOOTH EXTRACTION Bilateral     There were no vitals filed for this visit.   Subjective Assessment - 06/17/22 1722     Currently in Pain? No/denies    Pain Score 0-No pain                  Group Session:  S: Doing better today I believe.   O: The objective of the telehealth group therapy session was to discuss the significance of routines in promoting mental health and wellbeing. The OT aimed to explore the power of routines in providing structure, stability, and predictability in individuals' lives, as well as their role in  establishing healthy habits, reducing stress and anxiety, managing time effectively, achieving goals, and fostering a sense of community and social connectedness.   Participants were guided to identify areas where routines could improve their mental health and were provided with tips for establishing and maintaining healthy routines. The session concluded by emphasizing the potential of routines to enhance overall quality of life.  Homework Assignment:  As part of the session, participants were assigned a homework task to reflect on their current routines and select one area of their lives where they could establish a new routine to improve their mental health and wellbeing. They were instructed to start small and implement the routine gradually, while seeking support from friends, family, or mental health professionals as needed. The participants were asked to report their progress in the next therapy session, focusing on the benefits and challenges encountered during the implementation of their chosen routine.   A: During the telehealth group therapy session, the patient actively participated in the discussion on the importance of routines in promoting mental health and wellbeing. The patient demonstrated a good understanding of the power of routines in providing structure, stability, and predictability in their life. They were able to identify areas in their life where routines could contribute to improving their overall mental health.  The patient showed motivation and willingness to reflect on their current  habits and behaviors, recognizing the need for positive changes. They actively engaged in the session, sharing personal experiences and challenges related to establishing and maintaining healthy routines. The patient expressed a desire to reduce stress and anxiety, manage their time more effectively, and achieve their goals through the implementation of routines.    P: Continue to attend PHP OT  group sessions 5x week for 4 weeks to promote daily structure, social engagement, and opportunities to develop and utilize adaptive strategies to maximize functional performance in preparation for safe transition and integration back into school, work, and the community. Plan to address topic of pt 3 in next OT group session.                 OT Education - 06/17/22 1722     Education Details Routines 2              OT Short Term Goals - 06/11/22 1754       OT SHORT TERM GOAL #1   Title Client will develop and utilize a personalized coping toolbox containing at least five coping strategies to manage challenging situations, demonstrating their use in real-life scenarios by the end of therapy.    Time 4    Period Weeks    Status On-going    Target Date 07/11/22      OT SHORT TERM GOAL #2   Title Client will set aside designated times for self-care activities (e.g., reading, meditation, exercise) and consistently engage in these activities 3 times a week.    Status On-going      OT SHORT TERM GOAL #3   Title By discharge, client will demonstrate the ability to adapt and modify routines when faced with unexpected events or changes, maintaining a balanced approach to daily tasks.    Status On-going      OT SHORT TERM GOAL #4   Title By the time of discharge, client will independently set, track, and make progress towards a long-term goal, demonstrating resilience in overcoming obstacles and seeking support when needed.    Status On-going                      Plan - 06/17/22 1726     Psychosocial Skills Habits;Coping Strategies;Interpersonal Interaction;Routines and Behaviors             Patient will benefit from skilled therapeutic intervention in order to improve the following deficits and impairments:       Psychosocial Skills: Habits, Coping Strategies, Interpersonal Interaction, Routines and Behaviors   Visit Diagnosis: Difficulty  coping    Problem List Patient Active Problem List   Diagnosis Date Noted   MDD (major depressive disorder), recurrent episode, severe (HCC) 06/07/2022   OCD (obsessive compulsive disorder) 06/07/2022    [name], OT 06/17/2022, 5:26 PM [name], OT  [facility] HOSPITALIZATION PROGRAM [address] [place], [place], [address] Phone: [phone]   Fax:  [phone]  Name: LOUVINIA DEFRANCISCO MRN: [id number] Date of Birth: 1995/10/21

## 2022-06-17 NOTE — Therapy (Signed)
[name] PARTIAL HOSPITALIZATION PROGRAM [address] [place], [place], [address] Phone: [phone]   Fax:  [phone]  Occupational Therapy Treatment Virtual Visit via Video Note  I connected with Catherine Salas on 06/17/22 at  8:00 AM EDT by a video enabled telemedicine application and verified that I am speaking with the correct person using two identifiers.  Location: Patient: home Provider: office   I discussed the limitations of evaluation and management by telemedicine and the availability of in person appointments. The patient expressed understanding and agreed to proceed.    The patient was advised to call back or seek an in-person evaluation if the symptoms worsen or if the condition fails to improve as anticipated.  I provided 55 minutes of non-face-to-face time during this encounter.   Patient Details  Name: Catherine Salas MRN: [id number] Date of Birth: 1995/07/29 No data recorded  Encounter Date: 06/17/2022   OT End of Session - 06/17/22 1802     Visit Number 5    Number of Visits 20    Date for OT Re-Evaluation 07/11/22    OT Start Time 1200    OT Stop Time 1255    OT Time Calculation (min) 55 min             Past Medical History:  Diagnosis Date   Anxiety    Depression    Obsessive-compulsive disorder     Past Surgical History:  Procedure Laterality Date   WISDOM TOOTH EXTRACTION Bilateral     There were no vitals filed for this visit.   Subjective Assessment - 06/17/22 1801     Currently in Pain? No/denies    Pain Score 0-No pain               Group Session:  S: Doing better today.   O: The objective of the telehealth group therapy session was to discuss the significance of routines in promoting mental health and wellbeing. The OT aimed to explore the power of routines in providing structure, stability, and predictability in individuals' lives, as well as their role in establishing healthy  habits, reducing stress and anxiety, managing time effectively, achieving goals, and fostering a sense of community and social connectedness.   Participants were guided to identify areas where routines could improve their mental health and were provided with tips for establishing and maintaining healthy routines. The session concluded by emphasizing the potential of routines to enhance overall quality of life.  Homework Assignment:  As part of the session, participants were assigned a homework task to reflect on their current routines and select one area of their lives where they could establish a new routine to improve their mental health and wellbeing. They were instructed to start small and implement the routine gradually, while seeking support from friends, family, or mental health professionals as needed. The participants were asked to report their progress in the next therapy session, focusing on the benefits and challenges encountered during the implementation of their chosen routine.   A: The patient showed motivation and willingness to reflect on their current habits and behaviors, recognizing the need for positive changes. They actively engaged in the session, sharing personal experiences and challenges related to establishing and maintaining healthy routines. The patient expressed a desire to reduce stress and anxiety, manage their time more effectively, and achieve their goals through the implementation of routines.    P: Continue to attend PHP OT group sessions 5x week for 4 weeks to  promote daily structure, social engagement, and opportunities to develop and utilize adaptive strategies to maximize functional performance in preparation for safe transition and integration back into school, work, and the community. Plan to address topic of pt 4 in next OT group session.                      OT Education - 06/17/22 1801     Education Details Routines 3               OT Short Term Goals - 06/11/22 1754       OT SHORT TERM GOAL #1   Title Client will develop and utilize a personalized coping toolbox containing at least five coping strategies to manage challenging situations, demonstrating their use in real-life scenarios by the end of therapy.    Time 4    Period Weeks    Status On-going    Target Date 07/11/22      OT SHORT TERM GOAL #2   Title Client will set aside designated times for self-care activities (e.g., reading, meditation, exercise) and consistently engage in these activities 3 times a week.    Status On-going      OT SHORT TERM GOAL #3   Title By discharge, client will demonstrate the ability to adapt and modify routines when faced with unexpected events or changes, maintaining a balanced approach to daily tasks.    Status On-going      OT SHORT TERM GOAL #4   Title By the time of discharge, client will independently set, track, and make progress towards a long-term goal, demonstrating resilience in overcoming obstacles and seeking support when needed.    Status On-going                      Plan - 06/17/22 1802     Psychosocial Skills Habits;Coping Strategies;Interpersonal Interaction;Routines and Behaviors             Patient will benefit from skilled therapeutic intervention in order to improve the following deficits and impairments:       Psychosocial Skills: Habits, Coping Strategies, Interpersonal Interaction, Routines and Behaviors   Visit Diagnosis: Difficulty coping    Problem List Patient Active Problem List   Diagnosis Date Noted   MDD (major depressive disorder), recurrent episode, severe (HCC) 06/07/2022   OCD (obsessive compulsive disorder) 06/07/2022    [name], OT 06/17/2022, 6:02 PM  [name], OT   [facility] HOSPITALIZATION PROGRAM [address] [place], [place], [address] Phone: [phone]   Fax:  [phone]  Name:  Catherine Salas MRN: [id number] Date of Birth: 10/24/1995

## 2022-06-18 ENCOUNTER — Other Ambulatory Visit (HOSPITAL_COMMUNITY): Payer: 59

## 2022-06-18 DIAGNOSIS — F422 Mixed obsessional thoughts and acts: Secondary | ICD-10-CM

## 2022-06-18 DIAGNOSIS — F332 Major depressive disorder, recurrent severe without psychotic features: Secondary | ICD-10-CM

## 2022-06-18 DIAGNOSIS — F411 Generalized anxiety disorder: Secondary | ICD-10-CM

## 2022-06-18 DIAGNOSIS — R4589 Other symptoms and signs involving emotional state: Secondary | ICD-10-CM

## 2022-06-18 NOTE — Psych (Signed)
Virtual Visit via Video Note  I connected with Catherine Salas on 06/10/22 at  9:00 AM EDT by a video enabled telemedicine application and verified that I am speaking with the correct person using two identifiers.  Location: Patient: patient home Provider: clinical home office   I discussed the limitations of evaluation and management by telemedicine and the availability of in person appointments. The patient expressed understanding and agreed to proceed.  I discussed the assessment and treatment plan with the patient. The patient was provided an opportunity to ask questions and all were answered. The patient agreed with the plan and demonstrated an understanding of the instructions.   The patient was advised to call back or seek an in-person evaluation if the symptoms worsen or if the condition fails to improve as anticipated.  Pt was provided 240 minutes of non-face-to-face time during this encounter.   Donia Guiles, LCSW   [facility] PHP THERAPIST PROGRESS NOTE  Catherine Salas [id number]  Session Time: 9:00 - 10:00  Participation Level: Active  Behavioral Response: CasualAlertDepressed  Type of Therapy: Group Therapy  Treatment Goals addressed: Coping  Progress Towards Goals: Initial  Interventions: CBT, DBT, Supportive, and Reframing  Summary: Catherine Salas is a 27 y.o. female who presents with depression and anxiety symptoms.  Clinician led check-in regarding current stressors and situation, and review of patient completed daily inventory. Clinician utilized active listening and empathetic response and validated patient emotions. Clinician facilitated processing group on pertinent issues.?    Therapist Response: Patient arrived within time allowed. Patient rates her mood at a 3.8 on a scale of 1-10 with 10 being best. Pt states she feels "not as bad as I have been, but not great." Pt states she slept 6 hours and ate 3x. Pt reports she had a procedure two days ago  and spent yesterday recovering. Pt reports her siblings have been in town which has elevated her mood because she likes spending time with them. Pt reports difficulty feeling her feelings. Patient able to process. Patient engaged in discussion.          Session Time: 10:00 am - 11:00 am   Participation Level: Active   Behavioral Response: CasualAlertDepressed   Type of Therapy: Group Therapy   Treatment Goals addressed: Coping   Progress Towards Goals: Progressing   Interventions: CBT, DBT, Solution Focused, Strength-based, Supportive, and Reframing   Therapist Response: Cln led discussion on deep breathing and its therapeutic benefits, using DBT TIPP skills to inform discussion. Group practiced how to breathe from their diaphragms to ensure therapeutic quality and different ways to keep track of regulating breaths.    Therapist Response: Pt engaged in discussion and practice.          Session Time: 11:00 -12:00   Participation Level: Active   Behavioral Response: CasualAlertDepressed   Type of Therapy: Group Therapy   Treatment Goals addressed: Coping   Progress Towards Goals: Progressing   Interventions: CBT, DBT, Solution Focused, Strength-based, Supportive, and Reframing   Summary: Cln continued topic of DBT distress tolerance skills. Cln introduced Self-Soothe skills. Group discussed ways they can utilize the five senses to soothe themselves when struggling.   Therapist Response:  Pt engaged in discussion and identifies way to utilize the skill.        Session Time: 12:00 -1:00   Participation Level: Active   Behavioral Response: CasualAlertDepressed   Type of Therapy: Group therapy, Occupational Therapy   Treatment Goals addressed: Coping   Progress Towards  Goals: Progressing   Interventions: Supportive; Psychoeducation   Summary: 12:00 - 12:50: Occupational Therapy group led by cln E. Hollan. 12:50 - 1:00 Clinician assessed for immediate needs,  medication compliance and efficacy, and safety concerns.   Therapist Response: 12:00 - 12:50: See OT note 12:50 - 1:00 pm: At check-out, patient reports no immediate concerns. Patient demonstrates progress as evidenced by participating in first group session. Patient denies SI/HI/self-harm thoughts at the end of group.    Suicidal/Homicidal: Nowithout intent/plan  Plan: Pt will continue in PHP while working to decrease depression and anxiety symptoms, increase emotion regulation, and increase ability to manage symptoms in a healthy manner.   Collaboration of Care: Medication Management AEB J. McQuilla  Patient/Guardian was advised Release of Information must be obtained prior to any record release in order to collaborate their care with an outside provider. Patient/Guardian was advised if they have not already done so to contact the registration department to sign all necessary forms in order for [place] to release information regarding their care.   Consent: Patient/Guardian gives verbal consent for treatment and assignment of benefits for services provided during this visit. Patient/Guardian expressed understanding and agreed to proceed.   Diagnosis: Severe episode of recurrent major depressive disorder, without psychotic features (HCC) [F33.2]    1. Severe episode of recurrent major depressive disorder, without psychotic features (HCC)   2. GAD (generalized anxiety disorder)       Donia Guiles, LCSW

## 2022-06-18 NOTE — Psych (Signed)
Virtual Visit via Video Note  I connected with [name] on 06/11/22 at  9:00 AM EDT by a video enabled telemedicine application and verified that I am speaking with the correct person using two identifiers.  Location: Patient: patient home Provider: clinical home office   I discussed the limitations of evaluation and management by telemedicine and the availability of in person appointments. The patient expressed understanding and agreed to proceed.  I discussed the assessment and treatment plan with the patient. The patient was provided an opportunity to ask questions and all were answered. The patient agreed with the plan and demonstrated an understanding of the instructions.   The patient was advised to call back or seek an in-person evaluation if the symptoms worsen or if the condition fails to improve as anticipated.  Pt was provided 240 minutes of non-face-to-face time during this encounter.   Donia Guiles, LCSW   [facility] PHP THERAPIST PROGRESS NOTE  KAILYNE WOOLFORK [id number]  Session Time: 9:00 - 10:00  Participation Level: Active  Behavioral Response: CasualAlertDepressed  Type of Therapy: Group Therapy  Treatment Goals addressed: Coping  Progress Towards Goals: Initial  Interventions: CBT, DBT, Supportive, and Reframing  Summary: DEMOND SAHM is a 27 y.o. female who presents with depression and anxiety symptoms.  Clinician led check-in regarding current stressors and situation, and review of patient completed daily inventory. Clinician utilized active listening and empathetic response and validated patient emotions. Clinician facilitated processing group on pertinent issues.?    Therapist Response: Patient arrived within time allowed. Patient rates her mood at a 5 on a scale of 1-10 with 10 being best. Pt states she feels "a little bit better than yesterday." Pt states she slept 6 hours and ate 3x. Pt reports yesterday was difficult because she ran  into a person who is triggering to her. Pt reports spiraling. Pt reports managing by eating a doughnut and crying. Pt reports using distraction to help manage. Patient able to process. Patient engaged in discussion.          Session Time: 10:00 am - 11:00 am   Participation Level: Active   Behavioral Response: CasualAlertDepressed   Type of Therapy: Group Therapy   Treatment Goals addressed: Coping   Progress Towards Goals: Progressing   Interventions: CBT, DBT, Solution Focused, Strength-based, Supportive, and Reframing   Therapist Response:  Cln led discussion on anxious behaviors. Group members shared patterns of anxiety they experience. Cln encouraged pt's to increase insight into the roots of their anxious behaviors to be able to address triggers.      Therapist Response: Pt engaged in discussion and reports increased insight into triggers.          Session Time: 11:00 -12:00   Participation Level: Active   Behavioral Response: CasualAlertDepressed   Type of Therapy: Group Therapy   Treatment Goals addressed: Coping   Progress Towards Goals: Progressing   Interventions: CBT, DBT, Solution Focused, Strength-based, Supportive, and Reframing   Summary: Cln led discussion on ways to manage stressors and feelings over the weekend. Group members  brainstormed things to do over the weekend for multiple levels of energy, access, and moods. Cln reviewed crisis services should they be needed and provided pt's with the text crisis line, mobile crisis, national suicide hotline, [facility] 24/7 line, and information on [facility].     Therapist Response:  Pt engaged in discussion and is able to identify 3 ideas of what to do over the weekend to keep  their mind engaged.        Session Time: 12:00 -1:00   Participation Level: Active   Behavioral Response: CasualAlertDepressed   Type of Therapy: Group therapy, Occupational Therapy   Treatment Goals addressed: Coping    Progress Towards Goals: Progressing   Interventions: Supportive; Psychoeducation   Summary: 12:00 - 12:50: Occupational Therapy group led by cln E. Hollan. 12:50 - 1:00 Clinician assessed for immediate needs, medication compliance and efficacy, and safety concerns.   Therapist Response: 12:00 - 12:50: See OT note 12:50 - 1:00 pm: At check-out, patient reports no immediate concerns. Patient demonstrates progress as evidenced by continued engagement and responsiveness to treatment. Patient denies SI/HI/self-harm thoughts at the end of group.    Suicidal/Homicidal: Nowithout intent/plan  Plan: Pt will continue in PHP while working to decrease depression and anxiety symptoms, increase emotion regulation, and increase ability to manage symptoms in a healthy manner.   Collaboration of Care: Medication Management [employer]  Patient/Guardian was advised Release of Information must be obtained prior to any record release in order to collaborate their care with an outside provider. Patient/Guardian was advised if they have not already done so to contact the registration department to sign all necessary forms in order for [place] to release information regarding their care.   Consent: Patient/Guardian gives verbal consent for treatment and assignment of benefits for services provided during this visit. Patient/Guardian expressed understanding and agreed to proceed.   Diagnosis: Severe episode of recurrent major depressive disorder, without psychotic features (HCC) [F33.2]    1. Severe episode of recurrent major depressive disorder, without psychotic features (HCC)   2. GAD (generalized anxiety disorder)       Donia Guiles, LCSW

## 2022-06-19 NOTE — Psych (Signed)
Virtual Visit via Video Note  I connected with Catherine Salas on 06/15/22 at  9:00 AM EDT by a video enabled telemedicine application and verified that I am speaking with the correct person using two identifiers.  Location: Patient: patient home Provider: clinical home office   I discussed the limitations of evaluation and management by telemedicine and the availability of in person appointments. The patient expressed understanding and agreed to proceed.  I discussed the assessment and treatment plan with the patient. The patient was provided an opportunity to ask questions and all were answered. The patient agreed with the plan and demonstrated an understanding of the instructions.   The patient was advised to call back or seek an in-person evaluation if the symptoms worsen or if the condition fails to improve as anticipated.  Pt was provided 240 minutes of non-face-to-face time during this encounter.   Donia Guiles, LCSW   [facility] PHP THERAPIST PROGRESS NOTE  Catherine Salas [id number]  Session Time: 9:00 - 10:00  Participation Level: Active  Behavioral Response: CasualAlertDepressed  Type of Therapy: Group Therapy  Treatment Goals addressed: Coping  Progress Towards Goals: Progressing  Interventions: CBT, DBT, Supportive, and Reframing  Summary: Catherine Salas is a 27 y.o. female who presents with depression and anxiety symptoms.  Clinician led check-in regarding current stressors and situation, and review of patient completed daily inventory. Clinician utilized active listening and empathetic response and validated patient emotions. Clinician facilitated processing group on pertinent issues.?    Therapist Response: Patient arrived within time allowed. Patient rates her mood at a 4.5 on a scale of 1-10 with 10 being best. Pt states she feels "a good tired." Pt states she slept 7 hours and ate 3x. Pt reports she was social three days in a row and feels drained. Pt  shares it was good to see her friends and enjoyed herself however struggled with negative self-talk during and after the hangouts. Pt states resting outside of the social times due to her low energy. Patient able to process. Patient engaged in discussion.          Session Time: 10:00 am - 11:00 am   Participation Level: Active   Behavioral Response: CasualAlertDepressed   Type of Therapy: Group Therapy   Treatment Goals addressed: Coping   Progress Towards Goals: Progressing   Interventions: CBT, DBT, Solution Focused, Strength-based, Supportive, and Reframing   Therapist Response:  Cln led discussion on reverting to old behaviors. Group members discussed ways in which they feel they have reverted currently or in the past. Group members report fear of reverting to old behaviors and they struggle to manage that fear. Cln informed discussion with CBT thought challenging and DBT distress tolerance skills.    Therapist Response: Pt engaged in discussion.         Session Time: 11:00 -12:00   Participation Level: Active   Behavioral Response: CasualAlertDepressed   Type of Therapy: Group Therapy   Treatment Goals addressed: Coping   Progress Towards Goals: Progressing   Interventions: CBT, DBT, Solution Focused, Strength-based, Supportive, and Reframing   Summary: Cln introduced topic of stress management and the model of the "4 A's of stress management:" avoid, alter, accept, and adapt. Group members worked through Engineer, structural and discussed barriers to utilizing the 4 A's for stressors.    Therapist Response:  Pt engaged in discussion and reports understanding of how to utilize the 4 A's.        Session Time: 12:00 -1:00  Participation Level: Active   Behavioral Response: CasualAlertDepressed   Type of Therapy: Group therapy, Occupational Therapy   Treatment Goals addressed: Coping   Progress Towards Goals: Progressing   Interventions: Supportive; Psychoeducation    Summary: 12:00 - 12:50: Occupational Therapy group led by cln E. Hollan. 12:50 - 1:00 Clinician assessed for immediate needs, medication compliance and efficacy, and safety concerns.   Therapist Response: 12:00 - 12:50: See OT note 12:50 - 1:00 pm: At check-out, patient reports no immediate concerns. Patient demonstrates progress as evidenced by continued engagement and responsiveness to treatment. Patient denies SI/HI/self-harm thoughts at the end of group.    Suicidal/Homicidal: Nowithout intent/plan  Plan: Pt will continue in PHP while working to decrease depression and anxiety symptoms, increase emotion regulation, and increase ability to manage symptoms in a healthy manner.   Collaboration of Care: Medication Management [employer]Quilla  Patient/Guardian was advised Release of Information must be obtained prior to any record release in order to collaborate their care with an outside provider. Patient/Guardian was advised if they have not already done so to contact the registration department to sign all necessary forms in order for [place] to release information regarding their care.   Consent: Patient/Guardian gives verbal consent for treatment and assignment of benefits for services provided during this visit. Patient/Guardian expressed understanding and agreed to proceed.   Diagnosis: Severe episode of recurrent major depressive disorder, without psychotic features (HCC) [F33.2]    1. Severe episode of recurrent major depressive disorder, without psychotic features (HCC)   2. Mixed obsessional thoughts and acts       [employer], [address]

## 2022-06-19 NOTE — Psych (Signed)
Virtual Visit via Video Note  I connected with Catherine Salas on 06/16/22 at  9:00 AM EDT by a video enabled telemedicine application and verified that I am speaking with the correct person using two identifiers.  Location: Patient: patient home Provider: clinical home office   I discussed the limitations of evaluation and management by telemedicine and the availability of in person appointments. The patient expressed understanding and agreed to proceed.  I discussed the assessment and treatment plan with the patient. The patient was provided an opportunity to ask questions and all were answered. The patient agreed with the plan and demonstrated an understanding of the instructions.   The patient was advised to call back or seek an in-person evaluation if the symptoms worsen or if the condition fails to improve as anticipated.  Pt was provided 240 minutes of non-face-to-face time during this encounter.   Donia Guiles, LCSW   [facility] PHP THERAPIST PROGRESS NOTE  Catherine Salas [id number]  Session Time: 9:00 - 10:00  Participation Level: Active  Behavioral Response: CasualAlertDepressed  Type of Therapy: Group Therapy  Treatment Goals addressed: Coping  Progress Towards Goals: Progressing  Interventions: CBT, DBT, Supportive, and Reframing  Summary: Catherine Salas is a 27 y.o. female who presents with depression and anxiety symptoms.  Clinician led check-in regarding current stressors and situation, and review of patient completed daily inventory. Clinician utilized active listening and empathetic response and validated patient emotions. Clinician facilitated processing group on pertinent issues.?    Therapist Response: Patient arrived within time allowed. Patient rates her mood at a 4 on a scale of 1-10 with 10 being best. Pt states she feels "the same." Pt states she slept 6 hours and ate 3x. Pt reports she felt overwhelmed yesterday due to the weight of care giving  for her mom. Pt reports her obsessive thoughts were increased yesterday and she took her PRN medications and utilized distraction to manage. Pt reports desire to not be knocked down as hard by her symptoms.  Patient able to process. Patient engaged in discussion.          Session Time: 10:00 am - 11:00 am   Participation Level: Active   Behavioral Response: CasualAlertDepressed   Type of Therapy: Group Therapy   Treatment Goals addressed: Coping   Progress Towards Goals: Progressing   Interventions: CBT, DBT, Solution Focused, Strength-based, Supportive, and Reframing   Therapist Response: Cln led processing group for pt's current struggles. Group members shared stressors and provided support and feedback. Cln brought in topics of boundaries, healthy relationships, and unhealthy thought processes to inform discussion.    Therapist Response: Pt able to process and provide support to group.          Session Time: 11:00 -12:00   Participation Level: Active   Behavioral Response: CasualAlertDepressed   Type of Therapy: Group Therapy, Spiritual Care   Treatment Goals addressed: Coping   Progress Towards Goals: Progressing   Interventions: Supportive, Education   Summary:  Catherine Salas, Chaplain, led group.   Therapist Response: Pt participated        Session Time: 12:00 -1:00   Participation Level: Active   Behavioral Response: CasualAlertDepressed   Type of Therapy: Group therapy, Occupational Therapy   Treatment Goals addressed: Coping   Progress Towards Goals: Progressing   Interventions: Supportive; Psychoeducation   Summary: 12:00 - 12:50: Occupational Therapy group led by cln E. Hollan. 12:50 - 1:00 Clinician assessed for immediate needs, medication compliance and efficacy, and safety  concerns.   Therapist Response: 12:00 - 12:50: See OT note 12:50 - 1:00 pm: At check-out, patient reports no immediate concerns. Patient demonstrates progress as evidenced  by continued engagement and responsiveness to treatment. Patient denies SI/HI/self-harm thoughts at the end of group.    Suicidal/Homicidal: Nowithout intent/plan  Plan: Pt will continue in PHP while working to decrease depression and anxiety symptoms, increase emotion regulation, and increase ability to manage symptoms in a healthy manner.   Collaboration of Care: Medication Management [employer]  Patient/Guardian was advised Release of Information must be obtained prior to any record release in order to collaborate their care with an outside provider. Patient/Guardian was advised if they have not already done so to contact the registration department to sign all necessary forms in order for [place] to release information regarding their care.   Consent: Patient/Guardian gives verbal consent for treatment and assignment of benefits for services provided during this visit. Patient/Guardian expressed understanding and agreed to proceed.   Diagnosis: Severe episode of recurrent major depressive disorder, without psychotic features (HCC) [F33.2]    1. Severe episode of recurrent major depressive disorder, without psychotic features (HCC)   2. GAD (generalized anxiety disorder)   3. Mixed obsessional thoughts and acts       [employer], [address]

## 2022-06-20 ENCOUNTER — Encounter (HOSPITAL_COMMUNITY): Payer: Self-pay

## 2022-06-20 NOTE — Therapy (Signed)
[facility] PARTIAL HOSPITALIZATION PROGRAM [address] [place], [place], [address] Phone: [phone]   Fax:  [phone]  Occupational Therapy Treatment Virtual Visit via Video Note  I connected with [name] on 06/20/22 at  8:00 AM EDT by a video enabled telemedicine application and verified that I am speaking with the correct person using two identifiers.  Location: Patient: home Provider: office   I discussed the limitations of evaluation and management by telemedicine and the availability of in person appointments. The patient expressed understanding and agreed to proceed.    The patient was advised to call back or seek an in-person evaluation if the symptoms worsen or if the condition fails to improve as anticipated.  I provided 55 minutes of non-face-to-face time during this encounter.   Patient Details  Name: Catherine Salas MRN: [id number] Date of Birth: 03-26-1995 No data recorded  Encounter Date: 06/18/2022   OT End of Session - 06/20/22 0850     Visit Number 6    Number of Visits 20    Date for OT Re-Evaluation 07/11/22    OT Start Time 1200    OT Stop Time 1255    OT Time Calculation (min) 55 min             Past Medical History:  Diagnosis Date   Anxiety    Depression    Obsessive-compulsive disorder     Past Surgical History:  Procedure Laterality Date   WISDOM TOOTH EXTRACTION Bilateral     There were no vitals filed for this visit.   Subjective Assessment - 06/20/22 0850     Currently in Pain? No/denies    Pain Score 0-No pain                Group Session:  S: Feeling better today.   O: The objective of this presentation is to provide a comprehensive understanding of the concept of "motivation" and its role in human behavior and well-being. The content covers various theories of motivation, including intrinsic and extrinsic motivators, and explores the psychological mechanisms that drive  individuals to achieve goals, overcome obstacles, and make decisions. By diving into real-world applications, the presentation aims to offer actionable strategies for enhancing motivation in different life domains, such as work, relationships, and personal growth. Utilizing a multi-disciplinary approach, this presentation integrates insights from psychology, neuroscience, and behavioral economics to present a holistic view of motivation. The objective is not only to educate the audience about the complexities and driving forces behind motivation but also to equip them with practical tools and techniques to improve their own motivation levels. By the end of the presentation, attendees should have a well-rounded understanding of what motivates human actions and how to harness this knowledge for personal and professional betterment.   A: The patient demonstrates a high level of engagement during the session, actively participating in discussions about motivation theories and their applicability to their own life. They show keen interest in learning new strategies to improve their motivation and even offer examples from their own experiences that align with the theories presented. Their level of self-awareness and willingness to invest in self-improvement suggest that they are well-positioned to benefit from the practical tools and techniques discussed. The patient's ability to articulate their goals and challenges further supports the likelihood of successfully implementing the strategies presented.    P: Continue to attend PHP OT group sessions 5x week for 4 weeks to promote daily structure, social engagement, and  opportunities to develop and utilize adaptive strategies to maximize functional performance in preparation for safe transition and integration back into school, work, and the community. Plan to address topic of pt 2 in next OT group session.                   OT Education - 06/20/22  0850     Education Details Motivation 1              OT Short Term Goals - 06/11/22 1754       OT SHORT TERM GOAL #1   Title Client will develop and utilize a personalized coping toolbox containing at least five coping strategies to manage challenging situations, demonstrating their use in real-life scenarios by the end of therapy.    Time 4    Period Weeks    Status On-going    Target Date 07/11/22      OT SHORT TERM GOAL #2   Title Client will set aside designated times for self-care activities (e.g., reading, meditation, exercise) and consistently engage in these activities 3 times a week.    Status On-going      OT SHORT TERM GOAL #3   Title By discharge, client will demonstrate the ability to adapt and modify routines when faced with unexpected events or changes, maintaining a balanced approach to daily tasks.    Status On-going      OT SHORT TERM GOAL #4   Title By the time of discharge, client will independently set, track, and make progress towards a long-term goal, demonstrating resilience in overcoming obstacles and seeking support when needed.    Status On-going                      Plan - 06/20/22 0850     Psychosocial Skills Habits;Coping Strategies;Interpersonal Interaction;Routines and Behaviors             Patient will benefit from skilled therapeutic intervention in order to improve the following deficits and impairments:       Psychosocial Skills: Habits, Coping Strategies, Interpersonal Interaction, Routines and Behaviors   Visit Diagnosis: Difficulty coping    Problem List Patient Active Problem List   Diagnosis Date Noted   MDD (major depressive disorder), recurrent episode, severe (HCC) 06/07/2022   OCD (obsessive compulsive disorder) 06/07/2022    [name], OT 06/20/2022, 8:51 AM  [name], OT   [facility] PROGRAM [address] [place], [place],  [address] Phone: [phone]   Fax:  [phone]  Name: ZIHAM KEEBLE MRN: [id number] Date of Birth: 1995/05/08

## 2022-06-20 NOTE — Psych (Signed)
Virtual Visit via Video Note  I connected with Catherine Salas on 06/18/22 at  9:00 AM EDT by a video enabled telemedicine application and verified that I am speaking with the correct person using two identifiers.  Location: Patient: patient home Provider: clinical home office   I discussed the limitations of evaluation and management by telemedicine and the availability of in person appointments. The patient expressed understanding and agreed to proceed.  I discussed the assessment and treatment plan with the patient. The patient was provided an opportunity to ask questions and all were answered. The patient agreed with the plan and demonstrated an understanding of the instructions.   The patient was advised to call back or seek an in-person evaluation if the symptoms worsen or if the condition fails to improve as anticipated.  Pt was provided 120 minutes of non-face-to-face time during this encounter.   Donia Guiles, LCSW   [facility] PHP THERAPIST PROGRESS NOTE  YAKELYN FITZER [id number]  Session Time: 9:00 - 10:00  Participation Level: Active  Behavioral Response: CasualAlertDepressed  Type of Therapy: Group Therapy  Treatment Goals addressed: Coping  Progress Towards Goals: Progressing  Interventions: CBT, DBT, Supportive, and Reframing  Summary: Catherine Salas is a 27 y.o. female who presents with depression and anxiety symptoms.  Clinician led check-in regarding current stressors and situation, and review of patient completed daily inventory. Clinician utilized active listening and empathetic response and validated patient emotions. Clinician facilitated processing group on pertinent issues.?    Therapist Response: Patient arrived within time allowed. Patient rates her mood at a 5 on a scale of 1-10 with 10 being best. Pt states she feels "better than yesterday." Pt states she slept 7.5 hours and ate 3x. Pt reports she went to [employer] yesterday to get out of  the house and she was happy for a change of scenery. Pt reports sleeping deeper. Pt states she took a new increased dose of Prozac this morning and is feeling very drowsy. Patient able to process. Patient engaged in discussion.            Session Time: 10:00 am - 11:00 am   Participation Level: Active   Behavioral Response: CasualAlertDepressed   Type of Therapy: Group Therapy   Treatment Goals addressed: Coping   Progress Towards Goals: Progressing   Interventions: CBT, DBT, Solution Focused, Strength-based, Supportive, and Reframing   Therapist Response: Cln led discussion on trust and how to establish healthy trust. Group discussed common missteps such as disclosing personal information too soon, ignoring behaviors being displayed, inaccurately defining the relationship, and not giving people credit. Cln utilized CBT thought challenging principles to encourage pt's to rely on "evidence" versus feelings. Group members shared struggles they experience with trust.   Therapist Response: Pt engaged in discussion and is able to identify  areas of improvement in their trust process.     **Pt chose to leave group at 11 stating she could not stay awake. Pt denies SI before leaving group.    Suicidal/Homicidal: Nowithout intent/plan  Plan: Pt will continue in PHP while working to decrease depression and anxiety symptoms, increase emotion regulation, and increase ability to manage symptoms in a healthy manner.   Collaboration of Care: Medication Management [employer]  Patient/Guardian was advised Release of Information must be obtained prior to any record release in order to collaborate their care with an outside provider. Patient/Guardian was advised if they have not already done so to contact the registration department to  sign all necessary forms in order for [place] to release information regarding their care.   Consent: Patient/Guardian gives verbal consent for treatment and  assignment of benefits for services provided during this visit. Patient/Guardian expressed understanding and agreed to proceed.   Diagnosis: Severe episode of recurrent major depressive disorder, without psychotic features (HCC) [F33.2]    1. Severe episode of recurrent major depressive disorder, without psychotic features (HCC)   2. GAD (generalized anxiety disorder)   3. Mixed obsessional thoughts and acts       [employer], [address]

## 2022-06-20 NOTE — Psych (Signed)
Virtual Visit via Video Note  I connected with Catherine Salas on 06/17/22 at  9:00 AM EDT by a video enabled telemedicine application and verified that I am speaking with the correct person using two identifiers.  Location: Patient: patient home Provider: clinical home office   I discussed the limitations of evaluation and management by telemedicine and the availability of in person appointments. The patient expressed understanding and agreed to proceed.  I discussed the assessment and treatment plan with the patient. The patient was provided an opportunity to ask questions and all were answered. The patient agreed with the plan and demonstrated an understanding of the instructions.   The patient was advised to call back or seek an in-person evaluation if the symptoms worsen or if the condition fails to improve as anticipated.  Pt was provided 240 minutes of non-face-to-face time during this encounter.   Donia Guiles, LCSW   [facility] PHP THERAPIST PROGRESS NOTE  Catherine Salas [id number]  Session Time: 9:00 - 10:00  Participation Level: Active  Behavioral Response: CasualAlertDepressed  Type of Therapy: Group Therapy  Treatment Goals addressed: Coping  Progress Towards Goals: Progressing  Interventions: CBT, DBT, Supportive, and Reframing  Summary: Catherine Salas is a 27 y.o. female who presents with depression and anxiety symptoms.  Clinician led check-in regarding current stressors and situation, and review of patient completed daily inventory. Clinician utilized active listening and empathetic response and validated patient emotions. Clinician facilitated processing group on pertinent issues.?    Therapist Response: Patient arrived within time allowed. Patient rates her mood at a 5 on a scale of 1-10 with 10 being best. Pt states she feels "kind of off." Pt states she slept 7.5 hours and ate 3x. Pt reports she has been battling a headache. Pt shares she had a  difficult conversation with her parrents re: her mental health and the support she needs from them. Pt reports it went well initially and she now has to wait to see if change happens. Pt states she became overwhelmed and exhausted in the middle of the talk and took a 2.5 hour nap. Pt continues to struggle with energy, focus, and emotion regulation. Patient able to process. Patient engaged in discussion.            Session Time: 10:00 am - 11:00 am   Participation Level: Active   Behavioral Response: CasualAlertDepressed   Type of Therapy: Group Therapy   Treatment Goals addressed: Coping   Progress Towards Goals: Progressing   Interventions: CBT, DBT, Solution Focused, Strength-based, Supportive, and Reframing   Therapist Response: Cln led discussion on ways to reinforce new habits. Group shared ways in which they can set themselves up for good habits. Cln encouraged reminders in phone, post-it notes, educating support system, and practicing in low-stress environments.    Therapist Response: Pt engaged in discussion and is able to determine ways to reinforce positive habits.          Session Time: 11:00 -12:00   Participation Level: Active   Behavioral Response: CasualAlertDepressed   Type of Therapy: Group Therapy   Treatment Goals addressed: Coping   Progress Towards Goals: Progressing   Interventions: CBT, DBT, Solution Focused, Strength-based, Supportive, and Reframing   Summary: Cln led discussion on feelings and the role they play for our lives. Cln provided DBT wise mind framework to discuss facts about feelings and how to utilize these facts to help contextualize the feelings which we experience.     Therapist Response:  Pt engaged in discussion and reports understanding.         Session Time: 12:00 -1:00   Participation Level: Active   Behavioral Response: CasualAlertDepressed   Type of Therapy: Group therapy, Occupational Therapy   Treatment Goals  addressed: Coping   Progress Towards Goals: Progressing   Interventions: Supportive; Psychoeducation   Summary: 12:00 - 12:50: Occupational Therapy group led by cln E. Hollan. 12:50 - 1:00 Clinician assessed for immediate needs, medication compliance and efficacy, and safety concerns.   Therapist Response: 12:00 - 12:50: See OT note 12:50 - 1:00 pm: At check-out, patient reports no immediate concerns. Patient demonstrates progress as evidenced by continued engagement and responsiveness to treatment. Patient denies SI/HI/self-harm thoughts at the end of group.    Suicidal/Homicidal: Nowithout intent/plan  Plan: Pt will continue in PHP while working to decrease depression and anxiety symptoms, increase emotion regulation, and increase ability to manage symptoms in a healthy manner.   Collaboration of Care: Medication Management [employer]  Patient/Guardian was advised Release of Information must be obtained prior to any record release in order to collaborate their care with an outside provider. Patient/Guardian was advised if they have not already done so to contact the registration department to sign all necessary forms in order for [place] to release information regarding their care.   Consent: Patient/Guardian gives verbal consent for treatment and assignment of benefits for services provided during this visit. Patient/Guardian expressed understanding and agreed to proceed.   Diagnosis: Severe episode of recurrent major depressive disorder, without psychotic features (HCC) [F33.2]    1. Severe episode of recurrent major depressive disorder, without psychotic features (HCC)   2. GAD (generalized anxiety disorder)   3. Mixed obsessional thoughts and acts       [employer], [address]

## 2022-06-21 ENCOUNTER — Other Ambulatory Visit (HOSPITAL_COMMUNITY): Payer: 59 | Attending: Psychiatry

## 2022-06-21 ENCOUNTER — Other Ambulatory Visit (HOSPITAL_COMMUNITY): Payer: 59 | Admitting: Licensed Clinical Social Worker

## 2022-06-21 DIAGNOSIS — F411 Generalized anxiety disorder: Secondary | ICD-10-CM | POA: Insufficient documentation

## 2022-06-21 DIAGNOSIS — F422 Mixed obsessional thoughts and acts: Secondary | ICD-10-CM | POA: Insufficient documentation

## 2022-06-21 DIAGNOSIS — F332 Major depressive disorder, recurrent severe without psychotic features: Secondary | ICD-10-CM | POA: Insufficient documentation

## 2022-06-21 DIAGNOSIS — Z79899 Other long term (current) drug therapy: Secondary | ICD-10-CM | POA: Insufficient documentation

## 2022-06-21 DIAGNOSIS — F429 Obsessive-compulsive disorder, unspecified: Secondary | ICD-10-CM | POA: Diagnosis not present

## 2022-06-21 DIAGNOSIS — F4310 Post-traumatic stress disorder, unspecified: Secondary | ICD-10-CM | POA: Insufficient documentation

## 2022-06-21 DIAGNOSIS — F909 Attention-deficit hyperactivity disorder, unspecified type: Secondary | ICD-10-CM | POA: Insufficient documentation

## 2022-06-21 NOTE — Progress Notes (Signed)
Virtual Visit via Video Note  I connected with Catherine Salas on 06/21/22 at  9:00 AM EDT by a video enabled telemedicine application and verified that I am speaking with the correct person using two identifiers.  Location: Patient: Home Provider:  office   I discussed the limitations of evaluation and management by telemedicine and the availability of in person appointments. The patient expressed understanding and agreed to proceed.     I discussed the assessment and treatment plan with the patient. The patient was provided an opportunity to ask questions and all were answered. The patient agreed with the plan and demonstrated an understanding of the instructions.   The patient was advised to call back or seek an in-person evaluation if the symptoms worsen or if the condition fails to improve as anticipated.     Catherine Morton, MD  [facility] MD/PA/NP OP Progress Note  06/21/2022 12:51 PM Catherine Salas  MRN:  [id number]  Chief Complaint:  Chief Complaint  Patient presents with   Anxiety   HPI: Catherine Salas is a 27 yo patient with a PPH of depression, anxiety, ADHD, PTSD, OCD.  Admitted to PHP on 06/10/2022  Current regimen: Prozac 30mg  daily Hydroxyzine 25mg  daily PRN  Patient reports that he still has anxiety, but she has been taking the Hydroxyzine when she feels like she has a pending panic attack and this helps. Patient reports that she feels overwhelmed by trying to things at work sorted out. Patient reports she is feeling a lot of guilt about leaving work. Patient reports that her mom is also recovering from an invasive surgery as well, and the patient is her primary caretaker and getting around. Patient reports that feels guilt again because her parents want her to take care of herself.  Patient reports that she is sleeping "ok" she still takes time to fall asleep, but she slept more deeply last night. Patient reports that her appetite has increased, and she is not having  to force herself to eat as much. Patient denies SI, HI, and AVH.      Visit Diagnosis:    ICD-10-CM   1. GAD (generalized anxiety disorder)  F41.1     2. Mixed obsessional thoughts and acts  F42.2       Past Psychiatric History: INPT: None OPT: Art therapist in [place] Therapy: [facility] Suicide attempts and self- harm: no Medications: Lexapro (helped a bit but not with the OCD symptoms) Concerta and Vyvanse (both helped but affected sleep)  Past Medical History:  Past Medical History:  Diagnosis Date   Anxiety    Depression    Obsessive-compulsive disorder     Past Surgical History:  Procedure Laterality Date   WISDOM TOOTH EXTRACTION Bilateral     Family Psychiatric History: Mom: anxiety dx Brother: OCD and depression Cousin: OCD Etoh use d/o in multiple family members   Family History:  Family History  Problem Relation Age of Onset   Depression Mother    Anxiety disorder Mother    Anxiety disorder Brother    Depression Brother    OCD Brother    Alcohol abuse Maternal Grandmother    Depression Cousin    Anxiety disorder Cousin    OCD Cousin     Social History:  Social History   Socioeconomic History   Marital status: Single    Spouse name: Not on file   Number of children: 0   Years of education: Not on file   Highest education  level: Bachelor's degree (e.g., BA, AB, BS)  Occupational History   Not on file  Tobacco Use   Smoking status: Never   Smokeless tobacco: Never  Vaping Use   Vaping Use: Never used  Substance and Sexual Activity   Alcohol use: Yes    Comment: occassional/wedding or events   Drug use: Never   Sexual activity: Not on file  Other Topics Concern   Not on file  Social History Narrative   Not on file   Social Determinants of Health   Financial Resource Strain: Not on file  Food Insecurity: Not on file  Transportation Needs: Not on file  Physical Activity: Not on file  Stress: Not on file  Social  Connections: Not on file    Allergies:  Allergies  Allergen Reactions   Sulfa Antibiotics Hives   Amoxicillin Hives    Metabolic Disorder Labs: No results found for: "HGBA1C", "MPG" No results found for: "PROLACTIN" No results found for: "CHOL", "TRIG", "HDL", "CHOLHDL", "VLDL", "LDLCALC" No results found for: "TSH"  Therapeutic Level Labs: No results found for: "LITHIUM" No results found for: "VALPROATE" No results found for: "CBMZ"  Current Medications: Current Outpatient Medications  Medication Sig Dispense Refill   FLUoxetine (PROZAC) 10 MG capsule Take 3 capsules (30 mg total) by mouth daily. 90 capsule 0   hydrOXYzine (ATARAX) 10 MG tablet Take 1 tablet (10 mg total) by mouth 3 (three) times daily as needed. 90 tablet 0   Norgestim-Eth Estrad Triphasic (TRI-LO-MARZIA PO) Take by mouth daily. (Patient not taking: Reported on 06/16/2022)     No current facility-administered medications for this visit.      Psychiatric Specialty Exam: Review of Systems  Psychiatric/Behavioral:  Negative for dysphoric mood, hallucinations and suicidal ideas. The patient is nervous/anxious.     There were no vitals taken for this visit.There is no height or weight on file to calculate BMI.  General Appearance: Casual  Eye Contact:  Good  Speech:  Clear and Coherent  Volume:  Normal  Mood:  Euthymic  Affect:  Appropriate  Thought Process:  Coherent  Orientation:  Full (Time, Place, and Person)  Thought Content: Logical   Suicidal Thoughts:  No  Homicidal Thoughts:  No  Memory:  Immediate;   Good Recent;   Good  Judgement:  Fair  Insight:  Fair  Psychomotor Activity:  NA  Concentration:  Concentration: Good  Recall:  Good  Fund of Knowledge: Good  Language: Good  Akathisia:  No  Handed:    AIMS (if indicated): not done  Assets:  Communication Skills Desire for Improvement Housing Leisure Time Resilience Social Support Transportation  ADL's:  Intact  Cognition: WNL   Sleep:  Fair   Screenings: Insurance account manager from 06/16/2022 in [facility]AL [facility] PARTIAL HOSPITALIZATION PROGRAM Counselor from 06/07/2022 in BEHAVIORAL HEALTH PARTIAL HOSPITALIZATION PROGRAM  PHQ-2 Total Score 5 4  PHQ-9 Total Score 19 22      Flowsheet Row Counselor from 06/16/2022 in BEHAVIORAL HEALTH PARTIAL HOSPITALIZATION PROGRAM Counselor from 06/07/2022 in BEHAVIORAL HEALTH PARTIAL HOSPITALIZATION PROGRAM  C-SSRS RISK CATEGORY Error: Q3, 4, or 5 should not be populated when Q2 is No No Risk        Assessment and Plan: Based on assessment today patient continues to endorse symptoms of GAD, such as ruminating thoughts of guilt.  While patient appears to be doing work to attempt to reframe her thoughts, she may benefit from trying to use the hydroxyzine daily as it  does help her feel overall less anxious.  Patient was in agreement to doing this for the next week.  Otherwise patient's mood appears to be improved.  Patient also endorsed that she does have medication anxiety, and prefers to change 1 medication at a time if necessary. MDD GAD OCD PTSD - change Hydroxyzine 25mg  daily - Continue Prozac 30 mg daily - Continue to hold Ritalin 10 mg  Collaboration of Care: Collaboration of Care: PHP team  Patient/Guardian was advised Release of Information must be obtained prior to any record release in order to collaborate their care with an outside provider. Patient/Guardian was advised if they have not already done so to contact the registration department to sign all necessary forms in order for [place] to release information regarding their care.   Consent: Patient/Guardian gives verbal consent for treatment and assignment of benefits for services provided during this visit. Patient/Guardian expressed understanding and agreed to proceed.   PGY-3 Catherine Morton, MD 06/21/2022, 12:52 PM

## 2022-06-22 ENCOUNTER — Other Ambulatory Visit (HOSPITAL_COMMUNITY): Payer: 59 | Admitting: Licensed Clinical Social Worker

## 2022-06-22 ENCOUNTER — Other Ambulatory Visit (HOSPITAL_COMMUNITY): Payer: 59

## 2022-06-22 ENCOUNTER — Encounter (HOSPITAL_COMMUNITY): Payer: Self-pay

## 2022-06-22 DIAGNOSIS — R4589 Other symptoms and signs involving emotional state: Secondary | ICD-10-CM

## 2022-06-22 DIAGNOSIS — F422 Mixed obsessional thoughts and acts: Secondary | ICD-10-CM

## 2022-06-22 DIAGNOSIS — F332 Major depressive disorder, recurrent severe without psychotic features: Secondary | ICD-10-CM

## 2022-06-22 DIAGNOSIS — F411 Generalized anxiety disorder: Secondary | ICD-10-CM

## 2022-06-22 NOTE — Therapy (Signed)
[facility] PARTIAL HOSPITALIZATION PROGRAM [address] [place], [place], [address] Phone: [phone]   Fax:  [phone]  Occupational Therapy Treatment Virtual Visit via Video Note  I connected with Catherine Salas on 06/22/22 at  8:00 AM EDT by a video enabled telemedicine application and verified that I am speaking with the correct person using two identifiers.  Location: Patient: home Provider: office   I discussed the limitations of evaluation and management by telemedicine and the availability of in person appointments. The patient expressed understanding and agreed to proceed.    The patient was advised to call back or seek an in-person evaluation if the symptoms worsen or if the condition fails to improve as anticipated.  I provided 55 minutes of non-face-to-face time during this encounter.   Patient Details  Name: Catherine Salas MRN: [id number] Date of Birth: 02/02/1995 No data recorded  Encounter Date: 06/22/2022   OT End of Session - 06/22/22 1757     Visit Number 7    Number of Visits 20    Date for OT Re-Evaluation 07/11/22    OT Start Time 1200    OT Stop Time 1255    OT Time Calculation (min) 55 min             Past Medical History:  Diagnosis Date   Anxiety    Depression    Obsessive-compulsive disorder     Past Surgical History:  Procedure Laterality Date   WISDOM TOOTH EXTRACTION Bilateral     There were no vitals filed for this visit.   Subjective Assessment - 06/22/22 1756     Currently in Pain? No/denies    Pain Score 0-No pain                 Group Session:  S: Doing better today.   O: During the group therapy session, the occupational therapist discussed the impact of sleep disturbances on daily activities and overall health and wellbeing.   The OT also reviewed various types of sleep disorders, including insomnia, sleep apnea, restless leg syndrome, and narcolepsy, and their  associated symptoms. Strategies for managing and treating sleep disturbances were also discussed, such as establishing a consistent sleep routine, avoiding stimulants before bedtime, and engaging in relaxation techniques.   Today's group also included information on how sleep disturbances can cause fatigue, mood changes, cognitive impairment, and physical health problems, and emphasizes the importance of seeking prompt treatment to maintain overall health and wellbeing.   A: In today's session, the patient demonstrated active engagement with the topic of The Importance of Sleep. They eagerly asked questions, contributed personal experiences, and showcased a noticeable eagerness to apply the discussed principles. Their participation indicated not only a strong understanding of the subject matter but also an intrinsic motivation to implement better sleep practices in their daily routine. Based on their proactive involvement, it is assessed that the patient greatly benefited from today's treatment and will likely make efforts to incorporate the insights gained.    P: Continue to attend PHP OT group sessions 5x week for 4 weeks to promote daily structure, social engagement, and opportunities to develop and utilize adaptive strategies to maximize functional performance in preparation for safe transition and integration back into school, work, and the community. Plan to address topic of tbd in next OT group session.                  OT Education - 06/22/22 1756  Education Details Sleep 1              OT Short Term Goals - 06/11/22 1754       OT SHORT TERM GOAL #1   Title Client will develop and utilize a personalized coping toolbox containing at least five coping strategies to manage challenging situations, demonstrating their use in real-life scenarios by the end of therapy.    Time 4    Period Weeks    Status On-going    Target Date 07/11/22      OT SHORT TERM GOAL #2    Title Client will set aside designated times for self-care activities (e.g., reading, meditation, exercise) and consistently engage in these activities 3 times a week.    Status On-going      OT SHORT TERM GOAL #3   Title By discharge, client will demonstrate the ability to adapt and modify routines when faced with unexpected events or changes, maintaining a balanced approach to daily tasks.    Status On-going      OT SHORT TERM GOAL #4   Title By the time of discharge, client will independently set, track, and make progress towards a long-term goal, demonstrating resilience in overcoming obstacles and seeking support when needed.    Status On-going                      Plan - 06/22/22 1757     Psychosocial Skills Habits;Coping Strategies;Interpersonal Interaction;Routines and Behaviors             Patient will benefit from skilled therapeutic intervention in order to improve the following deficits and impairments:       Psychosocial Skills: Habits, Coping Strategies, Interpersonal Interaction, Routines and Behaviors   Visit Diagnosis: Difficulty coping    Problem List Patient Active Problem List   Diagnosis Date Noted   GAD (generalized anxiety disorder) 06/21/2022   MDD (major depressive disorder), recurrent episode, severe (HCC) 06/07/2022   OCD (obsessive compulsive disorder) 06/07/2022    [name], OT 06/22/2022, 5:57 PM [name], OT  [facility] HOSPITALIZATION PROGRAM [address] [place], [place], [address] Phone: [phone]   Fax:  [phone]  Name: Catherine Salas MRN: [id number] Date of Birth: Aug 02, 1995

## 2022-06-23 ENCOUNTER — Other Ambulatory Visit (HOSPITAL_COMMUNITY): Payer: 59 | Admitting: Licensed Clinical Social Worker

## 2022-06-23 ENCOUNTER — Other Ambulatory Visit (HOSPITAL_COMMUNITY): Payer: 59

## 2022-06-23 DIAGNOSIS — F332 Major depressive disorder, recurrent severe without psychotic features: Secondary | ICD-10-CM

## 2022-06-23 DIAGNOSIS — F411 Generalized anxiety disorder: Secondary | ICD-10-CM | POA: Diagnosis not present

## 2022-06-23 DIAGNOSIS — R4589 Other symptoms and signs involving emotional state: Secondary | ICD-10-CM

## 2022-06-24 ENCOUNTER — Other Ambulatory Visit (HOSPITAL_COMMUNITY): Payer: 59

## 2022-06-24 ENCOUNTER — Other Ambulatory Visit (HOSPITAL_COMMUNITY): Payer: 59 | Admitting: Licensed Clinical Social Worker

## 2022-06-24 ENCOUNTER — Encounter (HOSPITAL_COMMUNITY): Payer: Self-pay

## 2022-06-24 DIAGNOSIS — R4589 Other symptoms and signs involving emotional state: Secondary | ICD-10-CM

## 2022-06-24 DIAGNOSIS — F332 Major depressive disorder, recurrent severe without psychotic features: Secondary | ICD-10-CM

## 2022-06-24 DIAGNOSIS — F411 Generalized anxiety disorder: Secondary | ICD-10-CM | POA: Diagnosis not present

## 2022-06-24 NOTE — Therapy (Signed)
[facility] PARTIAL HOSPITALIZATION PROGRAM [address] [place], [place], [address] Phone: [phone]   Fax:  [phone]  Occupational Therapy Treatment Virtual Visit via Video Note  I connected with Catherine Salas on 06/24/22 at  8:00 AM EDT by a video enabled telemedicine application and verified that I am speaking with the correct person using two identifiers.  Location: Patient: home Provider: office   I discussed the limitations of evaluation and management by telemedicine and the availability of in person appointments. The patient expressed understanding and agreed to proceed.    The patient was advised to call back or seek an in-person evaluation if the symptoms worsen or if the condition fails to improve as anticipated.  I provided 55 minutes of non-face-to-face time during this encounter.   Patient Details  Name: Catherine Salas MRN: [id number] Date of Birth: Mar 27, 1995 No data recorded  Encounter Date: 06/23/2022   OT End of Session - 06/24/22 2306     Visit Number 8    Number of Visits 20    Date for OT Re-Evaluation 07/11/22    OT Start Time 1200    OT Stop Time 1255    OT Time Calculation (min) 55 min             Past Medical History:  Diagnosis Date   Anxiety    Depression    Obsessive-compulsive disorder     Past Surgical History:  Procedure Laterality Date   WISDOM TOOTH EXTRACTION Bilateral     There were no vitals filed for this visit.   Subjective Assessment - 06/24/22 2306     Currently in Pain? No/denies    Pain Score 0-No pain                 Group Session:  S: Feeling a bit better today actually.   O: The primary objective of this topic is to explore and understand the concept of occupational balance in the context of daily living. The term "occupational balance" is defined broadly, encompassing all activities that occupy an individual's time and energy, including self-care, leisure,  and work-related tasks. The goal is to guide participants towards achieving a harmonious blend of these activities, tailored to their personal values and life circumstances. This balance is aimed at enhancing overall well-being, not by equally distributing time across activities, but by ensuring that daily engagements are fulfilling and not draining. The content delves into identifying various barriers that individuals face in achieving occupational balance, such as overcommitment, misaligned priorities, external pressures, and lack of effective time management. The impact of these barriers on occupational performance, roles, and lifestyles is examined, highlighting issues like reduced efficiency, strained relationships, and potential health problems. Strategies for cultivating occupational balance are a key focus. These strategies include practical methods like time blocking, prioritizing tasks, establishing self-care rituals, decluttering, connecting with nature, and engaging in reflective practices. These approaches are designed to be adaptable and applicable to a wide range of life scenarios, promoting a proactive and mindful approach to daily living. The overall aim is to equip participants with the knowledge and tools to create a balanced lifestyle that supports their mental, emotional, and physical health, thereby improving their functional performance in daily life.   A:  The patient demonstrated a high level of engagement and active participation throughout the session on occupational balance. The patient frequently contributed to discussions, offering insightful reflections on personal experiences related to the barriers and strategies for achieving occupational balance.  There was a clear understanding of the concept and an ability to relate it to their own life. The patient showed enthusiasm in learning and applying the strategies discussed, such as time blocking and self-care rituals, indicating a  strong motivation to improve their occupational balance. The patient's proactive approach and responsiveness to the topic suggest a high potential for implementing these strategies effectively in their daily routine.  P: Continue to attend PHP OT group sessions 5x week for 4 weeks to promote daily structure, social engagement, and opportunities to develop and utilize adaptive strategies to maximize functional performance in preparation for safe transition and integration back into school, work, and the community. Plan to address topic of OB cont'd in next OT group session.                  OT Education - 06/24/22 2306     Education Details Occupational Balance              OT Short Term Goals - 06/11/22 1754       OT SHORT TERM GOAL #1   Title Client will develop and utilize a personalized coping toolbox containing at least five coping strategies to manage challenging situations, demonstrating their use in real-life scenarios by the end of therapy.    Time 4    Period Weeks    Status On-going    Target Date 07/11/22      OT SHORT TERM GOAL #2   Title Client will set aside designated times for self-care activities (e.g., reading, meditation, exercise) and consistently engage in these activities 3 times a week.    Status On-going      OT SHORT TERM GOAL #3   Title By discharge, client will demonstrate the ability to adapt and modify routines when faced with unexpected events or changes, maintaining a balanced approach to daily tasks.    Status On-going      OT SHORT TERM GOAL #4   Title By the time of discharge, client will independently set, track, and make progress towards a long-term goal, demonstrating resilience in overcoming obstacles and seeking support when needed.    Status On-going                      Plan - 06/24/22 2306     Psychosocial Skills Habits;Coping Strategies;Interpersonal Interaction;Routines and Behaviors              Patient will benefit from skilled therapeutic intervention in order to improve the following deficits and impairments:       Psychosocial Skills: Habits, Coping Strategies, Interpersonal Interaction, Routines and Behaviors   Visit Diagnosis: Difficulty coping    Problem List Patient Active Problem List   Diagnosis Date Noted   GAD (generalized anxiety disorder) 06/21/2022   MDD (major depressive disorder), recurrent episode, severe (HCC) 06/07/2022   OCD (obsessive compulsive disorder) 06/07/2022    [name], OT 06/24/2022, 11:07 PM  [name], OT   [facility] HOSPITALIZATION PROGRAM [address] [place], [place], [address] Phone: [phone]   Fax:  [phone]  Name: Catherine Salas MRN: [id number] Date of Birth: [birth year]

## 2022-06-25 ENCOUNTER — Other Ambulatory Visit (HOSPITAL_COMMUNITY): Payer: 59

## 2022-06-25 ENCOUNTER — Other Ambulatory Visit (HOSPITAL_COMMUNITY): Payer: 59 | Admitting: Licensed Clinical Social Worker

## 2022-06-25 DIAGNOSIS — R4589 Other symptoms and signs involving emotional state: Secondary | ICD-10-CM

## 2022-06-25 DIAGNOSIS — F332 Major depressive disorder, recurrent severe without psychotic features: Secondary | ICD-10-CM

## 2022-06-25 DIAGNOSIS — F411 Generalized anxiety disorder: Secondary | ICD-10-CM

## 2022-06-28 ENCOUNTER — Encounter (HOSPITAL_COMMUNITY): Payer: Self-pay

## 2022-06-28 ENCOUNTER — Telehealth (HOSPITAL_COMMUNITY): Payer: Self-pay | Admitting: Psychiatry

## 2022-06-28 ENCOUNTER — Other Ambulatory Visit (HOSPITAL_COMMUNITY): Payer: 59

## 2022-06-28 ENCOUNTER — Other Ambulatory Visit (HOSPITAL_COMMUNITY): Payer: 59 | Admitting: Licensed Clinical Social Worker

## 2022-06-28 DIAGNOSIS — F332 Major depressive disorder, recurrent severe without psychotic features: Secondary | ICD-10-CM

## 2022-06-28 DIAGNOSIS — F411 Generalized anxiety disorder: Secondary | ICD-10-CM | POA: Diagnosis not present

## 2022-06-28 DIAGNOSIS — F422 Mixed obsessional thoughts and acts: Secondary | ICD-10-CM

## 2022-06-28 DIAGNOSIS — R4589 Other symptoms and signs involving emotional state: Secondary | ICD-10-CM

## 2022-06-28 MED ORDER — PROPRANOLOL HCL 10 MG PO TABS: 10.0000 mg | ORAL_TABLET | Freq: Two times a day (BID) | ORAL | 0 refills | Status: DC | PRN

## 2022-06-28 NOTE — Progress Notes (Signed)
Spoke with patient via Teams video call, used 2 identifiers to correctly identify patient. States that groups are going well. She feels drowsy when taking Vistaril and will start Propanolol to help with anxiety. On scale 1-10 as 10 being worst she rates depression at 3 and anxiety at 7. Denies SI/HI or AV hallucinations. No issues or complaints. No side effect from medications.

## 2022-06-28 NOTE — Therapy (Signed)
[facility] PARTIAL HOSPITALIZATION PROGRAM [address] [place], [place], [address] Phone: [phone]   Fax:  [phone]  Occupational Therapy Treatment Virtual Visit via Video Note  I connected with [name] on 06/28/22 at  8:00 AM EDT by a video enabled telemedicine application and verified that I am speaking with the correct person using two identifiers.  Location: Patient: home Provider: office   I discussed the limitations of evaluation and management by telemedicine and the availability of in person appointments. The patient expressed understanding and agreed to proceed.    The patient was advised to call back or seek an in-person evaluation if the symptoms worsen or if the condition fails to improve as anticipated.  I provided 55 minutes of non-face-to-face time during this encounter.   Patient Details  Name: Catherine Salas MRN: [id number] Date of Birth: May 25, 1995 No data recorded  Encounter Date: 06/25/2022   OT End of Session - 06/28/22 1706     Visit Number 9    Number of Visits 20    Date for OT Re-Evaluation 07/11/22    OT Start Time 1200    OT Stop Time 1255    OT Time Calculation (min) 55 min             Past Medical History:  Diagnosis Date   Anxiety    Depression    Obsessive-compulsive disorder     Past Surgical History:  Procedure Laterality Date   WISDOM TOOTH EXTRACTION Bilateral     There were no vitals filed for this visit.   Subjective Assessment - 06/28/22 1706     Currently in Pain? No/denies    Pain Score 0-No pain                 Group Session:  S: Doing some bit better today I believe."  O: The primary objective of this topic is to explore and understand the concept of occupational balance in the context of daily living. The term "occupational balance" is defined broadly, encompassing all activities that occupy an individual's time and energy, including self-care, leisure,  and work-related tasks. The goal is to guide participants towards achieving a harmonious blend of these activities, tailored to their personal values and life circumstances. This balance is aimed at enhancing overall well-being, not by equally distributing time across activities, but by ensuring that daily engagements are fulfilling and not draining. The content delves into identifying various barriers that individuals face in achieving occupational balance, such as overcommitment, misaligned priorities, external pressures, and lack of effective time management. The impact of these barriers on occupational performance, roles, and lifestyles is examined, highlighting issues like reduced efficiency, strained relationships, and potential health problems. Strategies for cultivating occupational balance are a key focus. These strategies include practical methods like time blocking, prioritizing tasks, establishing self-care rituals, decluttering, connecting with nature, and engaging in reflective practices. These approaches are designed to be adaptable and applicable to a wide range of life scenarios, promoting a proactive and mindful approach to daily living. The overall aim is to equip participants with the knowledge and tools to create a balanced lifestyle that supports their mental, emotional, and physical health, thereby improving their functional performance in daily life.   A:  The patient demonstrated a high level of engagement and active participation throughout the session on occupational balance. The patient frequently contributed to discussions, offering insightful reflections on personal experiences related to the barriers and strategies for achieving occupational balance.  There was a clear understanding of the concept and an ability to relate it to their own life. The patient showed enthusiasm in learning and applying the strategies discussed, such as time blocking and self-care rituals, indicating a  strong motivation to improve their occupational balance. The patient's proactive approach and responsiveness to the topic suggest a high potential for implementing these strategies effectively in their daily routine.   P: Continue to attend PHP OT group sessions 5x week for 4 weeks to promote daily structure, social engagement, and opportunities to develop and utilize adaptive strategies to maximize functional performance in preparation for safe transition and integration back into school, work, and the community. Plan to address topic of tbd in next OT group session.                  OT Education - 06/28/22 1706     Education Details Occupational Balance              OT Short Term Goals - 06/11/22 1754       OT SHORT TERM GOAL #1   Title Client will develop and utilize a personalized coping toolbox containing at least five coping strategies to manage challenging situations, demonstrating their use in real-life scenarios by the end of therapy.    Time 4    Period Weeks    Status On-going    Target Date 07/11/22      OT SHORT TERM GOAL #2   Title Client will set aside designated times for self-care activities (e.g., reading, meditation, exercise) and consistently engage in these activities 3 times a week.    Status On-going      OT SHORT TERM GOAL #3   Title By discharge, client will demonstrate the ability to adapt and modify routines when faced with unexpected events or changes, maintaining a balanced approach to daily tasks.    Status On-going      OT SHORT TERM GOAL #4   Title By the time of discharge, client will independently set, track, and make progress towards a long-term goal, demonstrating resilience in overcoming obstacles and seeking support when needed.    Status On-going                      Plan - 06/28/22 1707     Psychosocial Skills Habits;Coping Strategies;Interpersonal Interaction;Routines and Behaviors             Patient  will benefit from skilled therapeutic intervention in order to improve the following deficits and impairments:       Psychosocial Skills: Habits, Coping Strategies, Interpersonal Interaction, Routines and Behaviors   Visit Diagnosis: Difficulty coping    Problem List Patient Active Problem List   Diagnosis Date Noted   GAD (generalized anxiety disorder) 06/21/2022   MDD (major depressive disorder), recurrent episode, severe (HCC) 06/07/2022   OCD (obsessive compulsive disorder) 06/07/2022    [name], OT 06/28/2022, 5:07 PM  [name], OT   [facility] HOSPITALIZATION PROGRAM [address] [place], [place], [address] Phone: [phone]   Fax:  [phone]  Name: KAISY SEVERINO MRN: [id number] Date of Birth: April 28, 1995

## 2022-06-28 NOTE — Progress Notes (Signed)
Virtual Visit via Video Note  I connected with Catherine Salas on 06/28/22 at  9:00 AM EDT by a video enabled telemedicine application and verified that I am speaking with the correct person using two identifiers.  Location: Patient: home Provider: office   I discussed the limitations of evaluation and management by telemedicine and the availability of in person appointments. The patient expressed understanding and agreed to proceed.   I discussed the assessment and treatment plan with the patient. The patient was provided an opportunity to ask questions and all were answered. The patient agreed with the plan and demonstrated an understanding of the instructions.   The patient was advised to call back or seek an in-person evaluation if the symptoms worsen or if the condition fails to improve as anticipated.    [name], MD  [facility] MD/PA/NP OP Progress Note  06/28/2022 11:35 AM Catherine Salas  MRN:  [id number]  Chief Complaint:  Chief Complaint  Patient presents with   Follow-up   Depression   Anxiety   HPI:  Catherine Salas is a 27 yo patient with a PPH of depression, anxiety, ADHD, PTSD, OCD.  Admitted to PHP on 06/10/2022  Current regimen: Prozac 30mg  daily Hydroxyzine 25mg  daily PRN   Patient reports that she is doing "ok." Patient reports that she is feeling a bit stable, patient reports that she still has some stress about things, but she had a good uneventful weekend.  Patient reports that her ex-bf is still reaching out to her, but there was not communications this weekend.Patient also reports that she is struggling with her FMLA, but this is beyond her control. Patient reports that this is causing her panic attacks. Patient reports reports that she is working in reframing her thoughts. Patient has also been keeping documentation.  Patient reports she is trying not to spiral down. Patient reports that her sleep has been good more days than not.   Patient is taking  Hydroxyzine BID. She finds that some days it is more helpful than others. Patient reports that it can still be sedating for patient.   Patient reports that she feels like her rumination has improved. Patient reports that her compulsions of touching corners has significantly decreased at 30mg  of Prozac, to the point that she did not even realize until it was brought up. Patient denies anhedonia and endorses having an overall better mood. Patient denies SI, HI, and AVH.   Visit Diagnosis:    ICD-10-CM   1. GAD (generalized anxiety disorder)  F41.1 propranolol (INDERAL) 10 MG tablet      Past Psychiatric History: INPT: None OPT: Gabrielle Montiece in [place] Therapy: Darden Amber Suicide attempts and self- harm: no Medications: Lexapro (helped a bit but not with the OCD symptoms) Concerta and Vyvanse (both helped but affected sleep)  Past Medical History:  Past Medical History:  Diagnosis Date   Anxiety    Depression    Obsessive-compulsive disorder     Past Surgical History:  Procedure Laterality Date   WISDOM TOOTH EXTRACTION Bilateral     Family Psychiatric History: Mom: anxiety dx Brother: OCD and depression Cousin: OCD Etoh use d/o in multiple family members     Family History:  Family History  Problem Relation Age of Onset   Depression Mother    Anxiety disorder Mother    Anxiety disorder Brother    Depression Brother    OCD Brother    Alcohol abuse Maternal Grandmother    Depression Cousin  Anxiety disorder Cousin    OCD Cousin     Social History:  Social History   Socioeconomic History   Marital status: Single    Spouse name: Not on file   Number of children: 0   Years of education: Not on file   Highest education level: Bachelor's degree (e.g., BA, AB, BS)  Occupational History   Not on file  Tobacco Use   Smoking status: Never   Smokeless tobacco: Never  Vaping Use   Vaping Use: Never used  Substance and Sexual Activity   Alcohol use: Yes     Comment: occassional/wedding or events   Drug use: Never   Sexual activity: Not on file  Other Topics Concern   Not on file  Social History Narrative   Not on file   Social Determinants of Health   Financial Resource Strain: Not on file  Food Insecurity: Not on file  Transportation Needs: Not on file  Physical Activity: Not on file  Stress: Not on file  Social Connections: Not on file    Allergies:  Allergies  Allergen Reactions   Sulfa Antibiotics Hives   Amoxicillin Hives    Metabolic Disorder Labs: No results found for: "HGBA1C", "MPG" No results found for: "PROLACTIN" No results found for: "CHOL", "TRIG", "HDL", "CHOLHDL", "VLDL", "LDLCALC" No results found for: "TSH"  Therapeutic Level Labs: No results found for: "LITHIUM" No results found for: "VALPROATE" No results found for: "CBMZ"  Current Medications: Current Outpatient Medications  Medication Sig Dispense Refill   FLUoxetine (PROZAC) 10 MG capsule Take 3 capsules (30 mg total) by mouth daily. 90 capsule 0   hydrOXYzine (ATARAX) 10 MG tablet Take 1 tablet (10 mg total) by mouth 3 (three) times daily as needed. 90 tablet 0   propranolol (INDERAL) 10 MG tablet Take 1 tablet (10 mg total) by mouth 2 (two) times daily as needed. 60 tablet 0   Norgestim-Eth Estrad Triphasic (TRI-LO-MARZIA PO) Take by mouth daily. (Patient not taking: Reported on 06/16/2022)     No current facility-administered medications for this visit.     Psychiatric Specialty Exam: Review of Systems  Psychiatric/Behavioral:  Negative for dysphoric mood, hallucinations and suicidal ideas. The patient is nervous/anxious.     There were no vitals taken for this visit.There is no height or weight on file to calculate BMI.  General Appearance: Casual  Eye Contact:  Good  Speech:  Clear and Coherent  Volume:  Normal  Mood:  Euthymic  Affect:  Appropriate  Thought Process:  Coherent  Orientation:  Full (Time, Place, and Person)   Thought Content: Logical   Suicidal Thoughts:  No  Homicidal Thoughts:  No  Memory:  Immediate;   Good Recent;   Good  Judgement:  Fair  Insight:  Good  Psychomotor Activity:  NA  Concentration:  Concentration: Good  Recall:  NA  Fund of Knowledge: Good  Language: Good  Akathisia:  NA  Handed:    AIMS (if indicated): not done  Assets:  Communication Skills Desire for Improvement Housing Leisure Time Resilience Social Support Transportation  ADL's:  Intact  Cognition: WNL  Sleep:  Fair   Screenings: Insurance account manager from 06/16/2022 in [facility]HAVIORAL [facility]ALTH PARTIAL HOSPITALIZATION PROGRAM Counselor from 06/07/2022 in BEHAVIORAL HEALTH PARTIAL HOSPITALIZATION PROGRAM  PHQ-2 Total Score 5 4  PHQ-9 Total Score 19 22      Flowsheet Row Counselor from 06/16/2022 in BEHAVIORAL HEALTH PARTIAL HOSPITALIZATION PROGRAM Counselor from 06/07/2022 in BEHAVIORAL HEALTH  PARTIAL HOSPITALIZATION PROGRAM  C-SSRS RISK CATEGORY Error: Q3, 4, or 5 should not be populated when Q2 is No No Risk        Assessment and Plan:  Based on assessment patient continues to have some benefit from her Prozac as she has noticed her OCD symptoms significantly decreased and some improvement in her anxiety.  Patient also notes significant improvement dysphoria.  While patient has had some mild improvement regarding her anxiety, she does continue to have panic attacks which have occasionally been helped by hydroxyzine however, some of the more intense ones do not feel as though they are being fully medicated and treated.  Patient appears to have improved insight and is actively working on coping skills to help with her anxiety.  Hydroxyzine is also sedating for patient, therefore will discontinue hydroxyzine for daytime anxiety and try propranolol twice daily as needed.  Patient is able to continue hydroxyzine nightly as needed for sleep.  MDD GAD OCD PTSD -Hydroxyzine 25 mg nightly as needed -  Start propranolol 10 mg twice daily as needed - Continue Prozac 30 mg daily - Continue to hold Ritalin 10 mg    Collaboration of Care: Collaboration of Care: PHP team  Patient/Guardian was advised Release of Information must be obtained prior to any record release in order to collaborate their care with an outside provider. Patient/Guardian was advised if they have not already done so to contact the registration department to sign all necessary forms in order for [place] to release information regarding their care.   Consent: Patient/Guardian gives verbal consent for treatment and assignment of benefits for services provided during this visit. Patient/Guardian expressed understanding and agreed to proceed.   PGY-3 [name], MD 06/28/2022, 11:35 AM

## 2022-06-29 ENCOUNTER — Other Ambulatory Visit (HOSPITAL_COMMUNITY): Payer: 59

## 2022-06-29 ENCOUNTER — Other Ambulatory Visit (HOSPITAL_COMMUNITY): Payer: 59 | Admitting: Licensed Clinical Social Worker

## 2022-06-29 DIAGNOSIS — F411 Generalized anxiety disorder: Secondary | ICD-10-CM | POA: Diagnosis not present

## 2022-06-29 DIAGNOSIS — F332 Major depressive disorder, recurrent severe without psychotic features: Secondary | ICD-10-CM

## 2022-06-29 DIAGNOSIS — R4589 Other symptoms and signs involving emotional state: Secondary | ICD-10-CM

## 2022-06-30 ENCOUNTER — Other Ambulatory Visit (HOSPITAL_COMMUNITY): Payer: 59 | Admitting: Licensed Clinical Social Worker

## 2022-06-30 ENCOUNTER — Encounter (HOSPITAL_COMMUNITY): Payer: Self-pay

## 2022-06-30 ENCOUNTER — Other Ambulatory Visit (HOSPITAL_COMMUNITY): Payer: 59

## 2022-06-30 DIAGNOSIS — R4589 Other symptoms and signs involving emotional state: Secondary | ICD-10-CM

## 2022-06-30 DIAGNOSIS — F332 Major depressive disorder, recurrent severe without psychotic features: Secondary | ICD-10-CM

## 2022-06-30 DIAGNOSIS — F411 Generalized anxiety disorder: Secondary | ICD-10-CM

## 2022-06-30 NOTE — Therapy (Signed)
[facility] PARTIAL HOSPITALIZATION PROGRAM [address] [place], [place], [address] Phone: [phone]   Fax:  [phone]  Occupational Therapy Treatment Virtual Visit via Video Note  I connected with Catherine Salas on 06/30/22 at  8:00 AM EDT by a video enabled telemedicine application and verified that I am speaking with the correct person using two identifiers.  Location: Patient: home Provider: office   I discussed the limitations of evaluation and management by telemedicine and the availability of in person appointments. The patient expressed understanding and agreed to proceed.    The patient was advised to call back or seek an in-person evaluation if the symptoms worsen or if the condition fails to improve as anticipated.  I provided 55 minutes of non-face-to-face time during this encounter.   Patient Details  Name: Catherine Salas MRN: [id number] Date of Birth: [birth year] No data recorded  Encounter Date: 06/28/2022   OT End of Session - 06/30/22 1026     Visit Number 10    Number of Visits 20    Date for OT Re-Evaluation 07/11/22    OT Start Time 1200    OT Stop Time 1255    OT Time Calculation (min) 55 min             Past Medical History:  Diagnosis Date   Anxiety    Depression    Obsessive-compulsive disorder     Past Surgical History:  Procedure Laterality Date   WISDOM TOOTH EXTRACTION Bilateral     There were no vitals filed for this visit.   Subjective Assessment - 06/30/22 1023     Currently in Pain? No/denies    Pain Score 0-No pain               Group Session:  S: Doing better today  O: The session centered on the neuroscientific underpinnings of motivation and its inverse relationship with procrastination. Through a review of current scientific literature, the group examined:  The function of dopamine as a critical neurotransmitter in the motivation circuitry of the brain. The  physiological and psychological aspects of procrastination, including the impact of dopamine on the tendency to delay tasks. Strategies for leveraging an understanding of dopamine's role to enhance motivation, address procrastination, and foster productive habits. The objective was to provide a framework for participants to understand the neurochemical dynamics of motivation, relate these to personal experiences of procrastination, and apply evidence-based methods to improve focus and drive in both professional and personal contexts.   A: The patient demonstrates a strong grasp of the material presented on the neurobiology of motivation and its implications for procrastination. They actively participated in discussions, showing an ability to connect scientific concepts to personal experiences. The patient exhibits insight into their own behavior patterns and expresses a keen interest in applying the strategies discussed to mitigate tendencies towards procrastination. The patient's engagement level indicates a readiness to implement behavioral changes that may enhance personal productivity and reduce procrastination.    P: Continue to attend PHP OT group sessions 5x week for 4 weeks to promote daily structure, social engagement, and opportunities to develop and utilize adaptive strategies to maximize functional performance in preparation for safe transition and integration back into school, work, and the community. Plan to address topic of tbd in next OT group session.                    OT Education - 06/30/22 1023  Education Details Procrastination              OT Short Term Goals - 06/11/22 1754       OT SHORT TERM GOAL #1   Title Client will develop and utilize a personalized coping toolbox containing at least five coping strategies to manage challenging situations, demonstrating their use in real-life scenarios by the end of therapy.    Time 4    Period Weeks     Status On-going    Target Date 07/11/22      OT SHORT TERM GOAL #2   Title Client will set aside designated times for self-care activities (e.g., reading, meditation, exercise) and consistently engage in these activities 3 times a week.    Status On-going      OT SHORT TERM GOAL #3   Title By discharge, client will demonstrate the ability to adapt and modify routines when faced with unexpected events or changes, maintaining a balanced approach to daily tasks.    Status On-going      OT SHORT TERM GOAL #4   Title By the time of discharge, client will independently set, track, and make progress towards a long-term goal, demonstrating resilience in overcoming obstacles and seeking support when needed.    Status On-going                      Plan - 06/30/22 1026     Psychosocial Skills Habits;Coping Strategies;Interpersonal Interaction;Routines and Behaviors             Patient will benefit from skilled therapeutic intervention in order to improve the following deficits and impairments:       Psychosocial Skills: Habits, Coping Strategies, Interpersonal Interaction, Routines and Behaviors   Visit Diagnosis: Difficulty coping    Problem List Patient Active Problem List   Diagnosis Date Noted   GAD (generalized anxiety disorder) 06/21/2022   MDD (major depressive disorder), recurrent episode, severe (HCC) 06/07/2022   OCD (obsessive compulsive disorder) 06/07/2022    [name], OT 06/30/2022, 10:26 AM  [name], OT   [facility] PROGRAM [address] [place], [place], [address] Phone: [phone]   Fax:  [phone]  Name: Catherine Salas MRN: [id number] Date of Birth: 07-14-1995

## 2022-07-01 ENCOUNTER — Encounter (HOSPITAL_COMMUNITY): Payer: Self-pay

## 2022-07-01 ENCOUNTER — Other Ambulatory Visit (HOSPITAL_COMMUNITY): Payer: 59

## 2022-07-01 ENCOUNTER — Other Ambulatory Visit (HOSPITAL_COMMUNITY): Payer: 59 | Admitting: Licensed Clinical Social Worker

## 2022-07-01 DIAGNOSIS — F411 Generalized anxiety disorder: Secondary | ICD-10-CM

## 2022-07-01 DIAGNOSIS — F332 Major depressive disorder, recurrent severe without psychotic features: Secondary | ICD-10-CM

## 2022-07-01 DIAGNOSIS — R4589 Other symptoms and signs involving emotional state: Secondary | ICD-10-CM

## 2022-07-01 NOTE — Progress Notes (Signed)
Virtual Visit via Video Note  I connected with Catherine Salas on 07/01/22 at  9:00 AM EDT by a video enabled telemedicine application and verified that I am speaking with the correct person using two identifiers.  Location: Patient: Home Provider: office   I discussed the limitations of evaluation and management by telemedicine and the availability of in person appointments. The patient expressed understanding and agreed to proceed.    I discussed the assessment and treatment plan with the patient. The patient was provided an opportunity to ask questions and all were answered. The patient agreed with the plan and demonstrated an understanding of the instructions.   The patient was advised to call back or seek an in-person evaluation if the symptoms worsen or if the condition fails to improve as anticipated.     [name], MD  [facility] Partial Hospitalization Program Psych Discharge Summary  Catherine Salas [id number]  Admission date: 06/10/2022 Discharge date: 07/01/2022  Reason for admission: Depression and Anxiety  Progress in Program Toward Treatment Goals: Progressing  Progress (rationale):  Catherine Salas is a 27 yo patient with a PPH of depression, anxiety, ADHD, PTSD, OCD.   Feels like she really related to others, talking about how she was feeling without intellectualizing, learning coping skills and thought distortions. Patient is sleeping well and has really found that the propanolol helps with her day time anxiety. Patient reports that her energy is great, and she her mood has improved. Patient endorses that she feels like she approach her issues/ stressors. Patient denies SI, HI, and AVH. Patient reports that her current regimen feels beneficial for her. Patietn reports a good appetite and feeling like she has a good plan in place to help her move forward from her ex and taking care of her mental health.   -Hydroxyzine 25 mg nightly as needed - continue  propranolol 10 mg twice daily as needed - Continue Prozac 30 mg daily - Continue to hold Ritalin 10 mg  Psychiatric Specialty Exam:   Review of Systems  Psychiatric/Behavioral:  Negative for dysphoric mood, hallucinations and suicidal ideas.     There were no vitals taken for this visit.There is no height or weight on file to calculate BMI.  General Appearance: Casual  Eye Contact:  Good  Speech:  Clear and Coherent  Volume:  Normal  Mood:  Euthymic  Affect:  Appropriate  Thought Process:  Coherent  Orientation:  Full (Time, Place, and Person)  Thought Content:  Logical  Suicidal Thoughts:  No  Homicidal Thoughts:  No  Memory:  Immediate;   Good Recent;   Good  Judgement:  Fair  Insight:  Good  Psychomotor Activity:  Normal  Concentration:  Concentration: Good  Recall:  NA  Fund of Knowledge:  Good  Language:  Good  Akathisia:  NA  Handed:    AIMS (if indicated):     Assets:  Communication Skills Desire for Improvement Housing Leisure Time Resilience Social Support Transportation Vocational/Educational  ADL's:  Intact  Cognition:  WNL  Sleep:   Good     Discharge Plan: IOP MDD GAD OCD PTSD -Hydroxyzine 25 mg nightly as needed - Continue propranolol 10 mg twice daily as needed - Continue Prozac 30 mg daily - Continue to hold Ritalin 10 mg Collaboration of Care: PHP team  Patient/Guardian was advised Release of Information must be obtained prior to any record release in order to collaborate their care with an outside provider. Patient/Guardian was advised if they  have not already done so to contact the registration department to sign all necessary forms in order for [place] to release information regarding their care.   Consent: Patient/Guardian gives verbal consent for treatment and assignment of benefits for services provided during this visit. Patient/Guardian expressed understanding and agreed to proceed.   PGY-3 Eliseo Gum, MD [facility]  CLINIC 07/01/2022

## 2022-07-01 NOTE — Therapy (Signed)
[facility] PARTIAL HOSPITALIZATION PROGRAM [address] [place], [place], [address] Phone: [phone]   Fax:  [phone]  Occupational Therapy Treatment Virtual Visit via Video Note  I connected with Catherine Salas on 07/01/22 at  8:00 AM EDT by a video enabled telemedicine application and verified that I am speaking with the correct person using two identifiers.  Location: Patient: home Provider: office   I discussed the limitations of evaluation and management by telemedicine and the availability of in person appointments. The patient expressed understanding and agreed to proceed.    The patient was advised to call back or seek an in-person evaluation if the symptoms worsen or if the condition fails to improve as anticipated.  I provided 55 minutes of non-face-to-face time during this encounter.   Patient Details  Name: Catherine Salas MRN: [id number] Date of Birth: 1996/01/15 No data recorded  Encounter Date: 06/29/2022   OT End of Session - 07/01/22 2307     Visit Number 11    Number of Visits 20    Date for OT Re-Evaluation 07/11/22    OT Start Time 1200    OT Stop Time 1255    OT Time Calculation (min) 55 min             Past Medical History:  Diagnosis Date   Anxiety    Depression    Obsessive-compulsive disorder     Past Surgical History:  Procedure Laterality Date   WISDOM TOOTH EXTRACTION Bilateral     There were no vitals filed for this visit.   Subjective Assessment - 07/01/22 2307     Currently in Pain? No/denies    Pain Score 0-No pain                Group Session:  S: Feeling better today  O: The session centered on the neuroscientific underpinnings of motivation and its inverse relationship with procrastination. Through a review of current scientific literature, the group examined:  The function of dopamine as a critical neurotransmitter in the motivation circuitry of the brain. The  physiological and psychological aspects of procrastination, including the impact of dopamine on the tendency to delay tasks. Strategies for leveraging an understanding of dopamine's role to enhance motivation, address procrastination, and foster productive habits. The objective was to provide a framework for participants to understand the neurochemical dynamics of motivation, relate these to personal experiences of procrastination, and apply evidence-based methods to improve focus and drive in both professional and personal contexts.   A:  The patient demonstrates a strong grasp of the material presented on the neurobiology of motivation and its implications for procrastination. They actively participated in discussions, showing an ability to connect scientific concepts to personal experiences. The patient exhibits insight into their own behavior patterns and expresses a keen interest in applying the strategies discussed to mitigate tendencies towards procrastination. The patient's engagement level indicates a readiness to implement behavioral changes that may enhance personal productivity and reduce procrastination.    P: Continue to attend PHP OT group sessions 5x week for 4 weeks to promote daily structure, social engagement, and opportunities to develop and utilize adaptive strategies to maximize functional performance in preparation for safe transition and integration back into school, work, and the community. Plan to address topic of tbd in next OT group session.                   OT Education - 07/01/22 2307  Education Details Procrastination              OT Short Term Goals - 06/11/22 1754       OT SHORT TERM GOAL #1   Title Client will develop and utilize a personalized coping toolbox containing at least five coping strategies to manage challenging situations, demonstrating their use in real-life scenarios by the end of therapy.    Time 4    Period Weeks     Status On-going    Target Date 07/11/22      OT SHORT TERM GOAL #2   Title Client will set aside designated times for self-care activities (e.g., reading, meditation, exercise) and consistently engage in these activities 3 times a week.    Status On-going      OT SHORT TERM GOAL #3   Title By discharge, client will demonstrate the ability to adapt and modify routines when faced with unexpected events or changes, maintaining a balanced approach to daily tasks.    Status On-going      OT SHORT TERM GOAL #4   Title By the time of discharge, client will independently set, track, and make progress towards a long-term goal, demonstrating resilience in overcoming obstacles and seeking support when needed.    Status On-going                      Plan - 07/01/22 2308     Psychosocial Skills Habits;Coping Strategies;Interpersonal Interaction;Routines and Behaviors             Patient will benefit from skilled therapeutic intervention in order to improve the following deficits and impairments:       Psychosocial Skills: Habits, Coping Strategies, Interpersonal Interaction, Routines and Behaviors   Visit Diagnosis: Difficulty coping    Problem List Patient Active Problem List   Diagnosis Date Noted   GAD (generalized anxiety disorder) 06/21/2022   MDD (major depressive disorder), recurrent episode, severe (HCC) 06/07/2022   OCD (obsessive compulsive disorder) 06/07/2022    Catherine Salas, OT 07/01/2022, 11:08 PM  [name], OT   [facility] HOSPITALIZATION PROGRAM [address] [place], [place], [address] Phone: [phone]   Fax:  [phone]  Name: Catherine Salas MRN: [id number] Date of Birth: 01/11/1996

## 2022-07-02 ENCOUNTER — Other Ambulatory Visit (HOSPITAL_COMMUNITY): Payer: 59

## 2022-07-02 ENCOUNTER — Encounter (HOSPITAL_COMMUNITY): Payer: Self-pay

## 2022-07-02 NOTE — Therapy (Signed)
[facility] PARTIAL HOSPITALIZATION PROGRAM [address] [place], [place], [address] Phone: [phone]   Fax:  [phone]  Occupational Therapy Treatment Virtual Visit via Video Note  I connected with Catherine Salas on 07/02/22 at  8:00 AM EDT by a video enabled telemedicine application and verified that I am speaking with the correct person using two identifiers.  Location: Patient: home Provider: office   I discussed the limitations of evaluation and management by telemedicine and the availability of in person appointments. The patient expressed understanding and agreed to proceed.    The patient was advised to call back or seek an in-person evaluation if the symptoms worsen or if the condition fails to improve as anticipated.  I provided 55 minutes of non-face-to-face time during this encounter.   Patient Details  Name: Catherine Salas MRN: [id number] Date of Birth: 04-11-1995 No data recorded  Encounter Date: 07/01/2022   OT End of Session - 07/02/22 0739     Visit Number 13    Number of Visits 20    Date for OT Re-Evaluation 07/11/22    OT Start Time 1200    OT Stop Time 1255    OT Time Calculation (min) 55 min             Past Medical History:  Diagnosis Date   Anxiety    Depression    Obsessive-compulsive disorder     Past Surgical History:  Procedure Laterality Date   WISDOM TOOTH EXTRACTION Bilateral     There were no vitals filed for this visit.   Subjective Assessment - 07/02/22 0739     Currently in Pain? No/denies    Pain Score 0-No pain               OCCUPATIONAL THERAPY DISCHARGE SUMMARY  Visits from Start of Care: 13  Current functional level related to goals / functional outcomes: All OT goals have been met and pt is ready to discharge on this date.    Remaining deficits: There are no remaining OT related deficits at this time.     Plan: Patient agrees to discharge.        Group  Session:  S: Better today.   O: In this communication group therapy session, facilitated by an occupational therapist, participants explored several key subtopics aimed at enhancing their interpersonal skills. The session began with a discussion on the use of "I" and "AND" statements, emphasizing personal responsibility and constructive language in expressing feelings and needs. Participants then practiced active listening techniques to improve their ability to fully understand and respond to others. The group also delved into assertive communication, learning how to express themselves confidently and respectfully. Emotional regulation skills were addressed, providing strategies for managing emotions during interactions. Social skills training included role-playing scenarios to build confidence in various social contexts. Feedback and reflection were integral parts of the session, with participants offering and receiving constructive feedback to foster personal growth. The group identified common barriers to effective communication, such as anxiety, fear of judgment, and past negative experiences, and discussed strategies to overcome these challenges, including mindfulness practices and building a supportive network.   A: The patient actively participated in the group therapy session, demonstrating a high level of engagement and enthusiasm. They contributed to discussions on "I" and "AND" statements by sharing personal examples and insights. During the active listening exercises, the patient was attentive and provided thoughtful feedback to peers. They effectively practiced assertive communication techniques and showed  a clear understanding of emotional regulation strategies. In social skills role-playing, the patient was confident and responsive, indicating a good grasp of the concepts. The patient also engaged in the feedback and reflection segment, offering constructive comments and showing receptivity to  feedback from others. Their proactive approach and willingness to explore personal barriers to communication suggest significant progress and motivation to improve interpersonal skills.                   OT Education - 07/02/22 0739     Education Details Communications 1              OT Short Term Goals - 06/11/22 1754       OT SHORT TERM GOAL #1   Title Client will develop and utilize a personalized coping toolbox containing at least five coping strategies to manage challenging situations, demonstrating their use in real-life scenarios by the end of therapy.    Time 4    Period Weeks    Status met   Target Date 07/11/22      OT SHORT TERM GOAL #2   Title Client will set aside designated times for self-care activities (e.g., reading, meditation, exercise) and consistently engage in these activities 3 times a week.    Status On-going  met     OT SHORT TERM GOAL #3   Title By discharge, client will demonstrate the ability to adapt and modify routines when faced with unexpected events or changes, maintaining a balanced approach to daily tasks.    Status met      OT SHORT TERM GOAL #4   Title By the time of discharge, client will independently set, track, and make progress towards a long-term goal, demonstrating resilience in overcoming obstacles and seeking support when needed.    Status met                     Plan - 07/02/22 0740     Psychosocial Skills Habits;Coping Strategies;Interpersonal Interaction;Routines and Behaviors             Patient will benefit from skilled therapeutic intervention in order to improve the following deficits and impairments:       Psychosocial Skills: Habits, Coping Strategies, Interpersonal Interaction, Routines and Behaviors   Visit Diagnosis: Difficulty coping    Problem List Patient Active Problem List   Diagnosis Date Noted   GAD (generalized anxiety disorder) 06/21/2022   MDD (major  depressive disorder), recurrent episode, severe (HCC) 06/07/2022   OCD (obsessive compulsive disorder) 06/07/2022    Catherine Salas, OT 07/02/2022, 7:40 AM Catherine Salas, OT  [facility] HOSPITALIZATION PROGRAM [address] [place], [place], [address] Phone: [phone]   Fax:  [phone]  Name: AVELYN BLUNCK MRN: [id number] Date of Birth: 1995-07-25

## 2022-07-02 NOTE — Therapy (Signed)
[facility] PARTIAL HOSPITALIZATION PROGRAM [address] [place], [place], [address] Phone: [phone]   Fax:  [phone]  Occupational Therapy Treatment Virtual Visit via Video Note  I connected with [name] on 07/02/22 at  8:00 AM EDT by a video enabled telemedicine application and verified that I am speaking with the correct person using two identifiers.  Location: Patient: home Provider: office   I discussed the limitations of evaluation and management by telemedicine and the availability of in person appointments. The patient expressed understanding and agreed to proceed.    The patient was advised to call back or seek an in-person evaluation if the symptoms worsen or if the condition fails to improve as anticipated.  I provided 55 minutes of non-face-to-face time during this encounter.   Patient Details  Name: Catherine Salas MRN: [id number] Date of Birth: 1995-12-19 No data recorded  Encounter Date: 06/30/2022   OT End of Session - 07/02/22 0014     Visit Number 12    Number of Visits 20    Date for OT Re-Evaluation 07/11/22    OT Start Time 1200    OT Stop Time 1255    OT Time Calculation (min) 55 min             Past Medical History:  Diagnosis Date   Anxiety    Depression    Obsessive-compulsive disorder     Past Surgical History:  Procedure Laterality Date   WISDOM TOOTH EXTRACTION Bilateral     There were no vitals filed for this visit.   Subjective Assessment - 07/02/22 0012     Currently in Pain? No/denies    Pain Score 0-No pain                  Group Session:  S: Feeling a bit better today.   O: The session centered on the neuroscientific underpinnings of motivation and its inverse relationship with procrastination. Through a review of current scientific literature, the group examined:  The function of dopamine as a critical neurotransmitter in the motivation circuitry of the  brain. The physiological and psychological aspects of procrastination, including the impact of dopamine on the tendency to delay tasks. Strategies for leveraging an understanding of dopamine's role to enhance motivation, address procrastination, and foster productive habits. The objective was to provide a framework for participants to understand the neurochemical dynamics of motivation, relate these to personal experiences of procrastination, and apply evidence-based methods to improve focus and drive in both professional and personal contexts.   A: The patient demonstrates a strong grasp of the material presented on the neurobiology of motivation and its implications for procrastination. They actively participated in discussions, showing an ability to connect scientific concepts to personal experiences. The patient exhibits insight into their own behavior patterns and expresses a keen interest in applying the strategies discussed to mitigate tendencies towards procrastination. The patient's engagement level indicates a readiness to implement behavioral changes that may enhance personal productivity and reduce procrastination.    P: Continue to attend PHP OT group sessions 5x week for 4 weeks to promote daily structure, social engagement, and opportunities to develop and utilize adaptive strategies to maximize functional performance in preparation for safe transition and integration back into school, work, and the community. Plan to address topic of tbd in next OT group session.                 OT Education - 07/02/22 0013  Education Details Procrastination              OT Short Term Goals - 06/11/22 1754       OT SHORT TERM GOAL #1   Title Client will develop and utilize a personalized coping toolbox containing at least five coping strategies to manage challenging situations, demonstrating their use in real-life scenarios by the end of therapy.    Time 4    Period Weeks     Status On-going    Target Date 07/11/22      OT SHORT TERM GOAL #2   Title Client will set aside designated times for self-care activities (e.g., reading, meditation, exercise) and consistently engage in these activities 3 times a week.    Status On-going      OT SHORT TERM GOAL #3   Title By discharge, client will demonstrate the ability to adapt and modify routines when faced with unexpected events or changes, maintaining a balanced approach to daily tasks.    Status On-going      OT SHORT TERM GOAL #4   Title By the time of discharge, client will independently set, track, and make progress towards a long-term goal, demonstrating resilience in overcoming obstacles and seeking support when needed.    Status On-going                      Plan - 07/02/22 0014     Psychosocial Skills Habits;Coping Strategies;Interpersonal Interaction;Routines and Behaviors             Patient will benefit from skilled therapeutic intervention in order to improve the following deficits and impairments:       Psychosocial Skills: Habits, Coping Strategies, Interpersonal Interaction, Routines and Behaviors   Visit Diagnosis: Difficulty coping    Problem List Patient Active Problem List   Diagnosis Date Noted   GAD (generalized anxiety disorder) 06/21/2022   MDD (major depressive disorder), recurrent episode, severe (HCC) 06/07/2022   OCD (obsessive compulsive disorder) 06/07/2022    [name], OT 07/02/2022, 12:15 AM  [name], OT   [facility] PROGRAM [address] [place], [place], [address] Phone: [phone]   Fax:  [phone]  Name: JERYN RAYBORN MRN: [id number] Date of Birth: 1995-07-01

## 2022-07-05 ENCOUNTER — Encounter (HOSPITAL_COMMUNITY): Payer: Self-pay | Admitting: Psychiatry

## 2022-07-05 ENCOUNTER — Encounter (HOSPITAL_COMMUNITY): Payer: Self-pay

## 2022-07-05 ENCOUNTER — Other Ambulatory Visit (HOSPITAL_COMMUNITY): Payer: 59

## 2022-07-05 ENCOUNTER — Other Ambulatory Visit (HOSPITAL_COMMUNITY): Payer: 59 | Admitting: Psychiatry

## 2022-07-05 DIAGNOSIS — F332 Major depressive disorder, recurrent severe without psychotic features: Secondary | ICD-10-CM

## 2022-07-05 DIAGNOSIS — F411 Generalized anxiety disorder: Secondary | ICD-10-CM

## 2022-07-05 NOTE — Progress Notes (Signed)
Virtual Visit via Video Note   I connected with [name], who prefers to go by "[name]" on 07/05/22 at  9:00 AM EDT by a video enabled telemedicine application and verified that I am speaking with the correct person using two identifiers.   At orientation to the IOP program, Case Manager discussed the limitations of evaluation and management by telemedicine and the availability of in person appointments. The patient expressed understanding and agreed to proceed with virtual visits throughout the duration of the program.   Location:  Patient: Patient Home Provider: OPT [facility] Office   History of Present Illness: MDD and GAD  Observations/Objective: Check In: Case Manager checked in with all participants to review discharge dates, insurance authorizations, work-related documents and needs from the treatment team regarding medications. [name] stated needs and engaged in discussion.    Initial Therapeutic Activity: Counselor facilitated a check-in with [name] to assess for safety, sobriety and medication compliance.  Counselor also inquired about [name]'s current emotional ratings, as well as any significant changes in thoughts, feelings or behavior since previous check in.  [name] presented for session on time and was alert, oriented x5, with no evidence or self-report of active SI/HI or A/V H.  [name] reported compliance with medication and denied use of alcohol or illicit substances.  [name] reported scores of 4/10 for depression, 7/10 for anxiety, and 0/10 for anger/irritability.  [name] denied any recent outbursts or panic attacks.  [name] reported that a recent success was completing PHP and deciding to continue with treatment by starting MHIOP today.  She reported that over to weekend she was able to assist her mother, which made her feel good.  [name] reported that a recent struggle has been worrying about her mother's health, who has a surgery today.  [name] reported that her goal today is to assist  her mother with caretaking following this surgery.       Second Therapeutic Activity: Counselor utilized a Cabin crew with group members today to guide discussion on topic of codependency.  This handout defined codependency as excessive emotional or psychological reliance upon someone who requires support on account of an illness or addiction.  It also explained how this issue presents in dysfunctional family systems, including behavior such as denying existence of problems, rigid boundaries on communication, strained trust, lack of individuality, and reinforcement of unhealthy coping mechanisms such as substance use.  Characteristics of co-dependent people were listed for assistance with identification, such as extreme need for approval/recognition, difficulty identifying feelings, poor communication, and more.  Members were also tasked with completing a questionnaire in order to identify signs of codependency and results were discussed afterward.  This handout also offered strategies for resolving co-dependency within one's network, including increased use of assertive communication skills in order to set appropriate boundaries.  Intervention effectiveness could not be measured, as [name] completed codependency questionnaire, with 11 out of 20 positive responses, but did not share her thoughts on these results, or actively engage in discussion on topic today.    Assessment and Plan: Counselor recommends that [name] remain in IOP treatment to better manage mental health symptoms, ensure stability and pursue completion of treatment plan goals. Counselor recommends adherence to crisis/safety plan, taking medications as prescribed, and following up with medical professionals if any issues arise.    Follow Up Instructions: Counselor will send Webex link for session tomorrow.  [name] was advised to call back or seek an in-person evaluation if the symptoms worsen or if the condition fails  to improve as  anticipated.   Collaboration of Care:   Medication Management AEB Dr. Eliseo Gum or Hillery Jacks, NP                                          Case Manager AEB Jeri Modena, CNA   Patient/Guardian was advised Release of Information must be obtained prior to any record release in order to collaborate their care with an outside provider. Patient/Guardian was advised if they have not already done so to contact the registration department to sign all necessary forms in order for [place] to release information regarding their care.   Consent: Patient/Guardian gives verbal consent for treatment and assignment of benefits for services provided during this visit. Patient/Guardian expressed understanding and agreed to proceed.  I provided 180 minutes of non-face-to-face time during this encounter.   Noralee Stain, [place], LCAS 07/05/22
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
## 2022-07-05 NOTE — Therapy (Signed)
[facility] PARTIAL HOSPITALIZATION PROGRAM [address] [place], [place], [address] Phone: [phone]   Fax:  [phone]  Occupational Therapy Treatment Virtual Visit via Video Note  I connected with Catherine Salas on 07/05/22 at  8:00 AM EDT by a video enabled telemedicine application and verified that I am speaking with the correct person using two identifiers.  Location: Patient: home Provider: office   I discussed the limitations of evaluation and management by telemedicine and the availability of in person appointments. The patient expressed understanding and agreed to proceed.    The patient was advised to call back or seek an in-person evaluation if the symptoms worsen or if the condition fails to improve as anticipated.  I provided 55 minutes of non-face-to-face time during this encounter.   Patient Details  Name: Catherine Salas MRN: [id number] Date of Birth: 1995-05-13 No data recorded  Encounter Date: 06/24/2022   OT End of Session - 07/05/22 1720     Visit Number 14    Number of Visits 20    Date for OT Re-Evaluation 07/11/22    OT Start Time 1200    OT Stop Time 1255    OT Time Calculation (min) 55 min             Past Medical History:  Diagnosis Date   Anxiety    Depression    Obsessive-compulsive disorder     Past Surgical History:  Procedure Laterality Date   WISDOM TOOTH EXTRACTION Bilateral     There were no vitals filed for this visit.   Subjective Assessment - 07/05/22 1720     Currently in Pain? No/denies    Pain Score 0-No pain                 Group Session:  S: Feeling better today  O: In this communication group therapy session, facilitated by an occupational therapist, participants explored several key subtopics aimed at enhancing their interpersonal skills. The session began with a discussion on the use of "I" and "AND" statements, emphasizing personal responsibility and constructive  language in expressing feelings and needs. Participants then practiced active listening techniques to improve their ability to fully understand and respond to others. The group also delved into assertive communication, learning how to express themselves confidently and respectfully. Emotional regulation skills were addressed, providing strategies for managing emotions during interactions. Social skills training included role-playing scenarios to build confidence in various social contexts. Feedback and reflection were integral parts of the session, with participants offering and receiving constructive feedback to foster personal growth. The group identified common barriers to effective communication, such as anxiety, fear of judgment, and past negative experiences, and discussed strategies to overcome these challenges, including mindfulness practices and building a supportive network.   A: The patient actively participated in the group therapy session, demonstrating a high level of engagement and enthusiasm. They contributed to discussions on "I" and "AND" statements by sharing personal examples and insights. During the active listening exercises, the patient was attentive and provided thoughtful feedback to peers. They effectively practiced assertive communication techniques and showed a clear understanding of emotional regulation strategies. In social skills role-playing, the patient was confident and responsive, indicating a good grasp of the concepts. The patient also engaged in the feedback and reflection segment, offering constructive comments and showing receptivity to feedback from others. Their proactive approach and willingness to explore personal barriers to communication suggest significant progress and motivation to improve interpersonal skills.  P: Continue to attend PHP OT group sessions 5x week for 4 weeks to promote daily structure, social engagement, and opportunities to develop and utilize  adaptive strategies to maximize functional performance in preparation for safe transition and integration back into school, work, and the community. Plan to address topic of pt 3 in next OT group session.                  OT Education - 07/05/22 1720     Education Details Communications 2              OT Short Term Goals - 06/11/22 1754       OT SHORT TERM GOAL #1   Title Client will develop and utilize a personalized coping toolbox containing at least five coping strategies to manage challenging situations, demonstrating their use in real-life scenarios by the end of therapy.    Time 4    Period Weeks    Status On-going    Target Date 07/11/22      OT SHORT TERM GOAL #2   Title Client will set aside designated times for self-care activities (e.g., reading, meditation, exercise) and consistently engage in these activities 3 times a week.    Status On-going      OT SHORT TERM GOAL #3   Title By discharge, client will demonstrate the ability to adapt and modify routines when faced with unexpected events or changes, maintaining a balanced approach to daily tasks.    Status On-going      OT SHORT TERM GOAL #4   Title By the time of discharge, client will independently set, track, and make progress towards a long-term goal, demonstrating resilience in overcoming obstacles and seeking support when needed.    Status On-going                      Plan - 07/05/22 1721     Psychosocial Skills Habits;Coping Strategies;Interpersonal Interaction;Routines and Behaviors             Patient will benefit from skilled therapeutic intervention in order to improve the following deficits and impairments:       Psychosocial Skills: Habits, Coping Strategies, Interpersonal Interaction, Routines and Behaviors   Visit Diagnosis: Difficulty coping    Problem List Patient Active Problem List   Diagnosis Date Noted   GAD (generalized anxiety disorder)  06/21/2022   MDD (major depressive disorder), recurrent episode, severe (HCC) 06/07/2022   OCD (obsessive compulsive disorder) 06/07/2022    [name], OT 07/05/2022, 5:21 PM [name], OT  [facility] HOSPITALIZATION PROGRAM [address] [place], [place], [address] Phone: [phone]   Fax:  [phone]  Name: Catherine Salas MRN: [id number] Date of Birth: 06/10/1995

## 2022-07-06 ENCOUNTER — Other Ambulatory Visit (HOSPITAL_COMMUNITY): Payer: 59

## 2022-07-06 NOTE — Psych (Signed)
Virtual Visit via Video Note  I connected with Catherine Salas on 06/21/22 at  9:00 AM EDT by a video enabled telemedicine application and verified that I am speaking with the correct person using two identifiers.  Location: Patient: patient home Provider: clinical home office   I discussed the limitations of evaluation and management by telemedicine and the availability of in person appointments. The patient expressed understanding and agreed to proceed.  I discussed the assessment and treatment plan with the patient. The patient was provided an opportunity to ask questions and all were answered. The patient agreed with the plan and demonstrated an understanding of the instructions.   The patient was advised to call back or seek an in-person evaluation if the symptoms worsen or if the condition fails to improve as anticipated.  Pt was provided 240 minutes of non-face-to-face time during this encounter.   Donia Guiles, LCSW   [facility] PHP THERAPIST PROGRESS NOTE  Catherine Salas [id number]  Session Time: 9:00 - 10:00  Participation Level: Active  Behavioral Response: CasualAlertDepressed  Type of Therapy: Group Therapy  Treatment Goals addressed: Coping  Progress Towards Goals: Progressing  Interventions: CBT, DBT, Supportive, and Reframing  Summary: Catherine Salas is a 27 y.o. female who presents with depression and anxiety symptoms.  Clinician led check-in regarding current stressors and situation, and review of patient completed daily inventory. Clinician utilized active listening and empathetic response and validated patient emotions. Clinician facilitated processing group on pertinent issues.?    Therapist Response: Patient arrived within time allowed. Patient rates her mood at a 4 on a scale of 1-10 with 10 being best. Pt states she feels anxious." Pt states she slept 6 hours and ate 3x. Pt reports she had a good weekend and spent time with her siblings which was  a mood booster. Pt reports she woke up feeling "panicky" and has been struggling with rumination and guilt since waking. Pt struggles with making herself a priority and personalization. Patient able to process. Patient engaged in discussion.            Session Time: 10:00 am - 11:00 am   Participation Level: Active   Behavioral Response: CasualAlertDepressed   Type of Therapy: Group Therapy   Treatment Goals addressed: Coping   Progress Towards Goals: Progressing   Interventions: CBT, DBT, Solution Focused, Strength-based, Supportive, and Reframing   Therapist Response: Cln introduced wellness topic of sleep hygiene. Cln discussed ways in which poor sleep affects our mood and overall wellness. Cln provided education on sleep hygiene techniques and principles. Group discussed their current sleep issues and how they can apply sleep hygiene skills to improve their quality of sleep.    Therapist Response:  Pt engaged in discussion and identifies which sleep hygiene skill they will apply first.            Session Time: 11:00 -12:00   Participation Level: Active   Behavioral Response: CasualAlertDepressed   Type of Therapy: Group Therapy   Treatment Goals addressed: Coping   Progress Towards Goals: Progressing   Interventions: CBT, DBT, Solution Focused, Strength-based, Supportive, and Reframing   Summary: Cln led discussion on negative self-talk and how it affects [place]. Cln utilized CBT to discuss how thoughts shape our feelings and actions. Group members shared how negative thinking affects them and worked to reframe their negative thinking.    Therapist Response:  Pt engaged in discussion and reports understanding.         Session Time: 12:00 -  1:00   Participation Level: Active   Behavioral Response: CasualAlertDepressed   Type of Therapy: Group therapy, Occupational Therapy   Treatment Goals addressed: Coping   Progress Towards Goals: Progressing    Interventions: Supportive; Psychoeducation   Summary: 12:00 - 12:50: Occupational Therapy group led by cln E. Hollan. 12:50 - 1:00 Clinician assessed for immediate needs, medication compliance and efficacy, and safety concerns.   Therapist Response: 12:00 - 12:50: See OT note 12:50 - 1:00 pm: At check-out, patient reports no immediate concerns. Patient demonstrates progress as evidenced by continued engagement and responsiveness to treatment. Patient denies SI/HI/self-harm thoughts at the end of group.    Suicidal/Homicidal: Nowithout intent/plan  Plan: Pt will continue in PHP while working to decrease depression and anxiety symptoms, increase emotion regulation, and increase ability to manage symptoms in a healthy manner.   Collaboration of Care: Medication Management AEB J. McQuilla  Patient/Guardian was advised Release of Information must be obtained prior to any record release in order to collaborate their care with an outside provider. Patient/Guardian was advised if they have not already done so to contact the registration department to sign all necessary forms in order for [place] to release information regarding their care.   Consent: Patient/Guardian gives verbal consent for treatment and assignment of benefits for services provided during this visit. Patient/Guardian expressed understanding and agreed to proceed.   Diagnosis: GAD (generalized anxiety disorder) [F41.1]    1. GAD (generalized anxiety disorder)   2. Mixed obsessional thoughts and acts   3. Severe episode of recurrent major depressive disorder, without psychotic features (HCC)       Donia Guiles, LCSW

## 2022-07-07 ENCOUNTER — Other Ambulatory Visit (HOSPITAL_COMMUNITY): Payer: 59

## 2022-07-07 ENCOUNTER — Other Ambulatory Visit (HOSPITAL_COMMUNITY): Payer: 59 | Attending: Psychiatry | Admitting: Licensed Clinical Social Worker

## 2022-07-07 DIAGNOSIS — F332 Major depressive disorder, recurrent severe without psychotic features: Secondary | ICD-10-CM

## 2022-07-07 DIAGNOSIS — F411 Generalized anxiety disorder: Secondary | ICD-10-CM

## 2022-07-07 NOTE — Progress Notes (Signed)
Virtual Visit via Video Note   I connected with [name], who prefers to go by "[name]" on 07/07/22 at  9:00 AM EDT by a video enabled telemedicine application and verified that I am speaking with the correct person using two identifiers.   At orientation to the IOP program, Case Manager discussed the limitations of evaluation and management by telemedicine and the availability of in person appointments. The patient expressed understanding and agreed to proceed with virtual visits throughout the duration of the program.   Location:  Patient: Patient Home Provider: [facility]   History of Present Illness: MDD and GAD  Observations/Objective: Check In: Case Manager checked in with all participants to review discharge dates, insurance authorizations, work-related documents and needs from the treatment team regarding medications. [name] stated needs and engaged in discussion.    Initial Therapeutic Activity: Counselor facilitated a check-in with [name] to assess for safety, sobriety and medication compliance.  Counselor also inquired about [name]'s current emotional ratings, as well as any significant changes in thoughts, feelings or behavior since previous check in.  [name] presented for session on time and was alert, oriented x5, with no evidence or self-report of active SI/HI or A/V H.  [name] reported compliance with medication and denied use of alcohol or illicit substances.  [name] reported scores of 4/10 for depression, 0/10 for anxiety, and 0/10 for anger/irritability.  [name] denied any recent outbursts or panic attacks.  [name] reported that a recent success was helping her mother following an operation.  [name] reported that a recent struggle was feeling depression more strongly yesterday, stating "I haven't been feeling as high energy and that may have to do with my mom".  [name] reported that her goal today is to assist her friend with moving some furniture.       Second Therapeutic  Activity: Counselor introduced Peggye Fothergill, [employer] Pharmacist, to provide psychoeducation on topic of medication compliance with members today.  Michelle Nasuti provided psychoeducation on classes of medications such as antidepressants, antipsychotics, what symptoms they are intended to treat, and any side effects one might encounter while on a particular prescription.  Time was allowed for clients to ask any questions they might have of [facility] regarding this specialty.  Intervention effectiveness could not be measured, as client did not participate.    Third Therapeutic Activity: Psycho-educational portion of group was co-facilitated by wellness director (David Stall, MS, MPH, CHES) focused on self-care in daily life. Facilitator and group members discussed presented materials regarding importance of sleep, diet, and exercise. Group members discussed any changes they are willing to make to improve an area of self-care in their lives (physical, psychological, emotional, spiritual, relationship, professional) to improve overall mental health as they continue with treatment.  Intervention was effective, as evidenced by [employer] participating in discussion with speaker on the subject, reporting that she is motivated to be more physical to improve overall wellness and health.  She reported that some physical activities she would be interested in adding to her weekly schedule is  swimming, taking walks, and yoga.     Assessment and Plan: Counselor recommends that [name] remain in IOP treatment to better manage mental health symptoms, ensure stability and pursue completion of treatment plan goals. Counselor recommends adherence to crisis/safety plan, taking medications as prescribed, and following up with medical professionals if any issues arise.    Follow Up Instructions: Counselor will send Webex link for session tomorrow.  [name] was advised to call back or seek an in-person  evaluation if the symptoms worsen or if the condition  fails to improve as anticipated.   Collaboration of Care:   Medication Management AEB Dr. Eliseo Gum or Hillery Jacks, NP                                          Case Manager AEB Jeri Modena, CNA   Patient/Guardian was advised Release of Information must be obtained prior to any record release in order to collaborate their care with an outside provider. Patient/Guardian was advised if they have not already done so to contact the registration department to sign all necessary forms in order for [place] to release information regarding their care.   Consent: Patient/Guardian gives verbal consent for treatment and assignment of benefits for services provided during this visit. Patient/Guardian expressed understanding and agreed to proceed.  I provided 180 minutes of non-face-to-face time during this encounter.   Noralee Stain, [place], LCAS 07/07/22

## 2022-07-08 ENCOUNTER — Other Ambulatory Visit (HOSPITAL_COMMUNITY): Payer: 59

## 2022-07-08 ENCOUNTER — Other Ambulatory Visit (HOSPITAL_COMMUNITY): Payer: 59 | Admitting: Licensed Clinical Social Worker

## 2022-07-08 DIAGNOSIS — F411 Generalized anxiety disorder: Secondary | ICD-10-CM | POA: Diagnosis not present

## 2022-07-08 DIAGNOSIS — F332 Major depressive disorder, recurrent severe without psychotic features: Secondary | ICD-10-CM

## 2022-07-08 NOTE — Progress Notes (Signed)
Virtual Visit via Video Note   I connected with Catherine Salas, who prefers to go by "Catherine Salas" on 07/08/22 at  9:00 AM EDT by a video enabled telemedicine application and verified that I am speaking with the correct person using two identifiers.   At orientation to the IOP program, Case Manager discussed the limitations of evaluation and management by telemedicine and the availability of in person appointments. The patient expressed understanding and agreed to proceed with virtual visits throughout the duration of the program.   Location:  Patient: Patient Home Provider: [facility]   History of Present Illness: MDD and GAD  Observations/Objective: Check In: Case Manager checked in with all participants to review discharge dates, insurance authorizations, work-related documents and needs from the treatment team regarding medications. Catherine Salas stated needs and engaged in discussion.    Initial Therapeutic Activity: Counselor facilitated a check-in with Catherine Salas to assess for safety, sobriety and medication compliance.  Counselor also inquired about Catherine Salas's current emotional ratings, as well as any significant changes in thoughts, feelings or behavior since previous check in.  Catherine Salas presented for session on time and was alert, oriented x5, with no evidence or self-report of active SI/HI or A/V H.  Catherine Salas reported compliance with medication and denied use of alcohol or illicit substances.  Catherine Salas reported scores of 3/10 for depression, 0/10 for anxiety, and 0/10 for anger/irritability.  Catherine Salas denied any recent outbursts or panic attacks.  Catherine Salas reported that a recent success was helping a friend move some furniture and spending quality time together afterward getting coffee and chatting.  Catherine Salas reported that a recent struggle was getting some bad news yesterday about some family member's health.  Catherine Salas reported that her goal today is to go to the pool and do some swimming for exercise.         Second  Therapeutic Activity: Counselor engaged the group in discussion on managing work/life balance today to improve mental health and wellness.  Counselor explained how finding balance between responsibilities at home and work place can be challenging, and lead to increased stress.  Counselor facilitated discussion on what challenges members are currently, or have historically faced.  Counselor also discussed strategies for improving work/life balance while members work on their mental health during treatment.  Some of these included keeping track of time management; creating a list of priorities and scaling importance; setting realistic, measurable goals each day; establishing boundaries; taking care of health needs; and nurturing relationships at home and work for support.  Counselor inquired about areas where members feel they are excelling, as well as areas they could focus on during treatment. 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  Catherine Salas stated "My work life balance has been nonexistent".  Catherine Salas reported that she experienced several symptoms of burnout during this time that pushed her to her limits, including feeling tired and drained, change in sleep habits, lack of motivation, procrastination, and withdrawing from others.  Catherine Salas reported that there have also been numerous warning signs such as feeling drained of energy before even going into her job each day, working through lunch, and feeling unhappy about the amount of time spent on work tasks.  Catherine Salas was receptive to suggestions offered today for addressing work life imbalance, including creating a Insurance claims handler, and working on refusal skills  in order to say "No" to unmanageable tasks and set healthier boundaries.   Assessment and Plan: Counselor  recommends that Catherine Salas remain in IOP treatment to better manage mental health symptoms, ensure stability and pursue completion of treatment plan goals. Counselor recommends adherence to crisis/safety plan, taking medications as prescribed, and following up with medical professionals if any issues arise.    Follow Up Instructions: Counselor will send Webex link for session tomorrow.  Catherine Salas was advised to call back or seek an in-person evaluation if the symptoms worsen or if the condition fails to improve as anticipated.   Collaboration of Care:   Medication Management AEB Dr. Eliseo Gum or Hillery Jacks, NP                                          Case Manager AEB Jeri Modena, CNA   Patient/Guardian was advised Release of Information must be obtained prior to any record release in order to collaborate their care with an outside provider. Patient/Guardian was advised if they have not already done so to contact the registration department to sign all necessary forms in order for [place] to release information regarding their care.   Consent: Patient/Guardian gives verbal consent for treatment and assignment of benefits for services provided during this visit. Patient/Guardian expressed understanding and agreed to proceed.  I provided 180 minutes of non-face-to-face time during this encounter.   Noralee Stain, LCSW, LCAS 07/08/22

## 2022-07-09 ENCOUNTER — Other Ambulatory Visit (HOSPITAL_COMMUNITY): Payer: 59

## 2022-07-09 ENCOUNTER — Telehealth (HOSPITAL_COMMUNITY): Payer: Self-pay | Admitting: Psychiatry

## 2022-07-09 ENCOUNTER — Other Ambulatory Visit (HOSPITAL_COMMUNITY): Payer: 59 | Admitting: Psychiatry

## 2022-07-12 ENCOUNTER — Other Ambulatory Visit (HOSPITAL_COMMUNITY): Payer: 59

## 2022-07-12 ENCOUNTER — Other Ambulatory Visit (HOSPITAL_COMMUNITY): Payer: 59 | Admitting: Psychiatry

## 2022-07-12 DIAGNOSIS — F332 Major depressive disorder, recurrent severe without psychotic features: Secondary | ICD-10-CM

## 2022-07-12 DIAGNOSIS — F411 Generalized anxiety disorder: Secondary | ICD-10-CM
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
## 2022-07-13 ENCOUNTER — Other Ambulatory Visit (HOSPITAL_COMMUNITY): Payer: 59

## 2022-07-13 ENCOUNTER — Encounter (HOSPITAL_COMMUNITY): Payer: Self-pay | Admitting: Family

## 2022-07-13 ENCOUNTER — Other Ambulatory Visit (HOSPITAL_COMMUNITY): Payer: 59 | Admitting: Licensed Clinical Social Worker

## 2022-07-13 DIAGNOSIS — F332 Major depressive disorder, recurrent severe without psychotic features: Secondary | ICD-10-CM

## 2022-07-13 DIAGNOSIS — F411 Generalized anxiety disorder: Secondary | ICD-10-CM | POA: Diagnosis not present

## 2022-07-13 MED ORDER — PROPRANOLOL HCL 10 MG PO TABS: 10.0000 mg | ORAL_TABLET | Freq: Two times a day (BID) | ORAL | 0 refills | Status: DC | PRN

## 2022-07-13 MED ORDER — FLUOXETINE HCL 10 MG PO CAPS: 30.0000 mg | ORAL_CAPSULE | Freq: Every day | ORAL | 0 refills | Status: DC

## 2022-07-13 MED ORDER — HYDROXYZINE HCL 10 MG PO TABS: 10.0000 mg | ORAL_TABLET | Freq: Three times a day (TID) | ORAL | 0 refills | Status: DC | PRN

## 2022-07-13 NOTE — Addendum Note (Signed)
Addended by: Oneta Rack on: 07/13/2022 10:38 AM   Modules accepted: Orders

## 2022-07-13 NOTE — Addendum Note (Signed)
Addended by: Oneta Rack on: 07/13/2022 10:14 AM   Modules accepted: Orders

## 2022-07-13 NOTE — Progress Notes (Signed)
Virtual Visit via Video Note   I connected with Catherine Salas, who prefers to go by "Catherine Salas" on 07/13/22 at  9:00 AM EDT by a video enabled telemedicine application and verified that I am speaking with the correct person using two identifiers.   At orientation to the IOP program, Case Manager discussed the limitations of evaluation and management by telemedicine and the availability of in person appointments. The patient expressed understanding and agreed to proceed with virtual visits throughout the duration of the program.   Location:  Patient: Patient Home Provider: [facility]   History of Present Illness: MDD and GAD  Observations/Objective: Check In: Case Manager checked in with all participants to review discharge dates, insurance authorizations, work-related documents and needs from the treatment team regarding medications. Catherine Salas stated needs and engaged in discussion.    Initial Therapeutic Activity: Counselor facilitated a check-in with Catherine Salas to assess for safety, sobriety and medication compliance.  Counselor also inquired about Catherine Salas's current emotional ratings, as well as any significant changes in thoughts, feelings or behavior since previous check in.  Catherine Salas presented for session on time and was alert, oriented x5, with no evidence or self-report of active SI/HI or A/V H.  Catherine Salas reported compliance with medication and denied use of alcohol or illicit substances.  Catherine Salas reported scores of 5/10 for depression, 3/10 for anxiety, and 0/10 for anger/irritability.  Catherine Salas denied any recent outbursts or panic attacks.  Catherine Salas reported that a recent success was finishing her to-do list yesterday, including several loads of laundry.  Catherine Salas reported that a recent struggle has been feeling heightened anxiety today, although she isn't sure of the source.  Catherine Salas reported that her goal today is to take her mother to two appointments and then take time to rest afterward and crochet.       Second  Therapeutic Activity: Counselor introduced topic of anger management today.  Counselor virtually shared a handout with members on this subject featuring a variety of coping skills, and facilitated discussion on these approaches.  Examples included raising awareness of anger triggers, practicing deep breathing, keeping an anger log to better understand episodes, using diversion activities to distract oneself for 30 minutes, taking a time out when necessary, and being mindful of warning signs tied to thoughts or behavior.  Counselor inquired about which techniques group members have used before, what has proved to be helpful, what their unique warning signs might be, as well as what they will try out in the future to assist with de-escalation.  Intervention effectiveness could not be measured, as Catherine Salas did not actively participate in discussion on subject.    Assessment and Plan: Counselor recommends that Catherine Salas remain in IOP treatment to better manage mental health symptoms, ensure stability and pursue completion of treatment plan goals. Counselor recommends adherence to crisis/safety plan, taking medications as prescribed, and following up with medical professionals if any issues arise.    Follow Up Instructions: Counselor will send Webex link for session tomorrow.  Catherine Salas was advised to call back or seek an in-person evaluation if the symptoms worsen or if the condition fails to improve as anticipated.   Collaboration of Care:   Medication Management AEB Dr. Eliseo Gum or Hillery Jacks, NP                                          Case Manager AEB Garibaldi Sink  Clark, CNA   Patient/Guardian was advised Release of Information must be obtained prior to any record release in order to collaborate their care with an outside provider. Patient/Guardian was advised if they have not already done so to contact the registration department to sign all necessary forms in order for [place] to release information regarding their  care.   Consent: Patient/Guardian gives verbal consent for treatment and assignment of benefits for services provided during this visit. Patient/Guardian expressed understanding and agreed to proceed.  I provided 180 minutes of non-face-to-face time during this encounter.   Noralee Stain, [place], LCAS 07/13/22

## 2022-07-13 NOTE — Progress Notes (Signed)
Patient is requesting medication refill.  Medication refilled x 60 days.  anticipated discharge 07/16/2022

## 2022-07-14 ENCOUNTER — Other Ambulatory Visit (HOSPITAL_COMMUNITY): Payer: 59 | Admitting: Licensed Clinical Social Worker

## 2022-07-14 ENCOUNTER — Other Ambulatory Visit (HOSPITAL_COMMUNITY): Payer: 59

## 2022-07-14 DIAGNOSIS — F411 Generalized anxiety disorder: Secondary | ICD-10-CM | POA: Diagnosis not present

## 2022-07-14 DIAGNOSIS — F332 Major depressive disorder, recurrent severe without psychotic features: Secondary | ICD-10-CM

## 2022-07-14 NOTE — Progress Notes (Signed)
Virtual Visit via Video Note   I connected with Catherine Salas, who prefers to go by "Catherine Salas" on 07/14/22 at  9:00 AM EDT by a video enabled telemedicine application and verified that I am speaking with the correct person using two identifiers.   At orientation to the IOP program, Case Manager discussed the limitations of evaluation and management by telemedicine and the availability of in person appointments. The patient expressed understanding and agreed to proceed with virtual visits throughout the duration of the program.   Location:  Patient: Patient Home [facility]: [facility]   History of Present Illness: MDD and GAD  Observations/Objective: Check In: Case Manager checked in with all participants to review discharge dates, insurance authorizations, work-related documents and needs from the treatment team regarding medications. Catherine Salas stated needs and engaged in discussion.    Initial Therapeutic Activity: Counselor facilitated a check-in with Catherine Salas to assess for safety, sobriety and medication compliance.  Counselor also inquired about Catherine Salas's current emotional ratings, as well as any significant changes in thoughts, feelings or behavior since previous check in.  Catherine Salas presented for session on time and was alert, oriented x5, with no evidence or self-report of active SI/HI or A/V H.  Catherine Salas reported compliance with medication and denied use of alcohol or illicit substances.  Catherine Salas reported scores of 5/10 for depression, 5/10 for anxiety, and 0/10 for anger/irritability.  Catherine Salas denied any recent outbursts.  Catherine Salas reported that a recent struggle was almost having a panic attack yesterday when she was ruminating on her ex, who can trigger negative thoughts.  Catherine Salas reported that a success was using distraction techniques to shift her focus elsewhere and calm down over time.  Catherine Salas reported that her goal today is to take her mother to an appointment and then swim some laps at the pool for  exercise.         Second Therapeutic Activity: Counselor introduced topic of building a social support network today.  Counselor explained how this can be defined as having a having a group of healthy people in one's life you can talk to, spend time with, and get help from to improve both mental and physical health.  Counselor noted that some barriers can make it difficult to connect with other people, including the presence of anxiety or depression, or moving to an unfamiliar area.  Group members were asked to assess the current state of their support network, and identify ways that this could be improved.  Tips were given on how to address previously noted barriers, such as strengthening social skills, using relaxation techniques to reduce anxiety, scheduling social time each week, and/or exploring social events nearby which could increase chances of meeting new supports.  Members were also encouraged to consider getting closer to people they already know through suggestions such as outreaching someone by text, email or phone call if they haven't spoken in awhile, doing something nice for a friend/family member unexpectedly, and/or inviting someone over for a game/movie/dinner night.  Intervention was effective, as evidenced by Catherine Salas actively participating in discussion on the subject, and reporting that she has a relatively large, and positive support network, but she has been isolated due to mental health issues.  Catherine Salas stated "Following up on calls has been hard for me".  She reported that her goal will be to make, and follow through on plans she makes with friends to strengthen existing connections, and be more mindful of the negative impact some people like her ex can have  on mental health so that boundaries can be adjusted accordingly.    Assessment and Plan: Counselor recommends that Catherine Salas remain in IOP treatment to better manage mental health symptoms, ensure stability and pursue completion of  treatment plan goals. Counselor recommends adherence to crisis/safety plan, taking medications as prescribed, and following up with medical professionals if any issues arise.    Follow Up Instructions: Counselor will send Webex link for session tomorrow.  Catherine Salas was advised to call back or seek an in-person evaluation if the symptoms worsen or if the condition fails to improve as anticipated.   Collaboration of Care:   Medication Management AEB Dr. Eliseo Gum or Hillery Jacks, NP                                          Case Manager AEB Jeri Modena, CNA   Patient/Guardian was advised Release of Information must be obtained prior to any record release in order to collaborate their care with an outside provider. Patient/Guardian was advised if they have not already done so to contact the registration department to sign all necessary forms in order for [place] to release information regarding their care.   Consent: Patient/Guardian gives verbal consent for treatment and assignment of benefits for services provided during this visit. Patient/Guardian expressed understanding and agreed to proceed.  I provided 180 minutes of non-face-to-face time during this encounter.   Noralee Stain, LCSW, LCAS 07/14/22

## 2022-07-15 ENCOUNTER — Other Ambulatory Visit (HOSPITAL_COMMUNITY): Payer: 59

## 2022-07-15 ENCOUNTER — Other Ambulatory Visit (HOSPITAL_COMMUNITY): Payer: 59 | Admitting: Psychiatry

## 2022-07-15 DIAGNOSIS — F411 Generalized anxiety disorder: Secondary | ICD-10-CM | POA: Diagnosis not present

## 2022-07-15 DIAGNOSIS — F332 Major depressive disorder, recurrent severe without psychotic features: Secondary | ICD-10-CM
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
## 2022-07-15 NOTE — Progress Notes (Signed)
Virtual Visit via Video Note  I connected with Catherine Salas on 07/15/22 at  9:00 AM EDT by a video enabled telemedicine application and verified that I am speaking with the correct person using two identifiers.  Location: Patient: Home Provider: Office   I discussed the limitations of evaluation and management by telemedicine and the availability of in person appointments. The patient expressed understanding and agreed to proceed.     I discussed the assessment and treatment plan with the patient. The patient was provided an opportunity to ask questions and all were answered. The patient agreed with the plan and demonstrated an understanding of the instructions.   The patient was advised to call back or seek an in-person evaluation if the symptoms worsen or if the condition fails to improve as anticipated.  I provided 20 minutes of non-face-to-face time during this encounter.   Catherine Morton, MD  [facility] Intensive Outpatient Program Discharge Summary  Catherine Salas [id number]  Admission date: 07/05/2022 Discharge date: 07/16/2022  Reason for admission: Anxiety and Depression  Chemical Use History: None  Family of Origin Issues:    Mom: anxiety dx Brother: OCD and depression Cousin: OCD Etoh use d/o in multiple family members  Progress in Program Toward Treatment Goals: Met  Progress (rationale): Catherine Salas is a 27 yo patient with a PPH of depression, anxiety, ADHD, PTSD, OCD. Recently dcd from [facility] where she was being treated for GAD and MDD.   Patient reports that she is doing "ok." Patient reports that she is excited about leaving for her trip. Patient reports that she has officially blocked her ex, she sent the text message that was created while in PHP. Unfortunately, it did not go well and patient did have a panic attack during this time, but she has slowly improved. She has had time to talk to the group with about what happened, and was able  to process better. Patient has been compliant with her medications. She denies SI, HI, and AVH. Patient endorses a good appetite and good sleep. Patient reports that she benefited from the group dynamics and learning how to reframe her thinking. Patient intends to continue her OP therapy and med mgmt.   Patient has been involved in her therapy and supportive of others. Patient endorsed really liking CBT.     - Dr. Jerrel Ivory in 09/2022: Will see her previous OPT provider in 08/2022 then transfer to [facility]  Psychiatric Specialty Exam:   Review of Systems  Psychiatric/Behavioral:  Negative for decreased concentration, dysphoric mood, hallucinations, sleep disturbance and suicidal ideas.     There were no vitals taken for this visit.There is no height or weight on file to calculate BMI.  General Appearance: Casual  Eye Contact:  Good  Speech:  Clear and Coherent  Volume:  Normal  Mood:  Euthymic  Affect:  Appropriate  Thought Process:  Coherent  Orientation:  Full (Time, Place, and Person)  Thought Content:  Logical  Suicidal Thoughts:  No  Homicidal Thoughts:  No  Memory:  Immediate;   Good Recent;   Good  Judgement:  Good  Insight:  Good  Psychomotor Activity:  Normal  Concentration:  Concentration: Good  Recall:  NA  Fund of Knowledge:  Good  Language:  Good  Akathisia:  NA  Handed:    AIMS (if indicated):     Assets:  Communication Skills Desire for Improvement Housing Leisure Time Resilience Social Support Talents/Skills Transportation Vocational/Educational  ADL's:  Intact  Cognition:  WNL  Sleep:   Good     Collaboration of Care:    MDD GAD OCD PTSD -Hydroxyzine 25 mg nightly as needed - Continue propranolol 10 mg twice daily as needed - Continue Prozac 30 mg daily - Continue to hold Ritalin 10 mg  Patient/Guardian was advised Release of Information must be obtained prior to any record release in order to collaborate their care with an outside provider.  Patient/Guardian was advised if they have not already done so to contact the registration department to sign all necessary forms in order for [place] to release information regarding their care.   Consent: Patient/Guardian gives verbal consent for treatment and assignment of benefits for services provided during this visit. Patient/Guardian expressed understanding and agreed to proceed.     PGY-3 Eliseo Gum ,MD BH-PIOPB [facility] 07/15/2022

## 2022-07-15 NOTE — Progress Notes (Signed)
Virtual Visit via Video Note   I connected with Catherine Salas, who prefers to go by "Catherine Salas" on 07/15/22 at  9:00 AM EDT by a video enabled telemedicine application and verified that I am speaking with the correct person using two identifiers.   At orientation to the IOP program, Case Manager discussed the limitations of evaluation and management by telemedicine and the availability of in person appointments. The patient expressed understanding and agreed to proceed with virtual visits throughout the duration of the program.   Location:  Patient: Patient Home Provider: OPT BH Office   History of Present Illness: MDD and GAD  Observations/Objective: Check In: Case Manager checked in with all participants to review discharge dates, insurance authorizations, work-related documents and needs from the treatment team regarding medications. Catherine Salas stated needs and engaged in discussion.    Initial Therapeutic Activity: Counselor facilitated a check-in with Catherine Salas to assess for safety, sobriety and medication compliance.  Counselor also inquired about Catherine Salas's current emotional ratings, as well as any significant changes in thoughts, feelings or behavior since previous check in.  Catherine Salas presented for session on time and was alert, oriented x5, with no evidence or self-report of active SI/HI or A/V H.  Catherine Salas reported compliance with medication and denied use of alcohol or illicit substances.  Catherine Salas reported scores of 4/10 for depression, 4/10 for anxiety, and 0/10 for anger/irritability.  Catherine Salas denied any recent outbursts or panic attacks.  Catherine Salas reported that a recent success was helping her mother get to 2 appointments yesterday, and then relaxing at the pool.  Catherine Salas reported that a recent struggle has been feeling under the weather, stating "It might be allergies, it feels congestive".  Catherine Salas reported that her goal today is to work on cleaning up her home and get more organized.       Second Therapeutic  Activity: Psycho-educational portion of group was provided by Catherine Salas, Interior and spatial designer of community education with [employer].  Catherine Salas provided information on history of her local agency, mission statement, and the variety of unique services offered which group members might find beneficial to engage in, including both virtual and in-person support groups, as well as peer support program for mentoring.  Catherine Salas offered time to answer member's questions regarding services and encouraged them to consider utilizing these services to assist in working towards their individual wellness goals.  Intervention effectiveness could not be measured, as client was meeting separately with psychiatrist during this discussion.    Third Therapeutic Activity: Counselor introduced topic of stress management today.  Counselor provided definition of stress as feeling tense, overwhelmed, worn out, and/or exhausted, and noted that in small amounts, stress can be motivating until things become too overwhelming to manage.  Counselor also explained how stress can be acute (brief but intense) or chronic (long-lasting) and this can impact the severity of symptoms one can experience in the physical, emotional, and behavioral categories.  Counselor inquired about members' specific stressors, how long they have been prevalent, and the various symptoms that tend to manifest as a result.  Counselor also offered several stress management strategies to help improve members' coping ability, including journaling, gratitude practice, relaxation techniques, and time management tips.  Counselor also explained that research has shown a strong support network composed of trusted family, friends, or community members can increase resilience in times of stress, and inquired about who members can reach out to for help in managing stressors.  Counselor encouraged members to consider discussing stressor 'red flags' with their  close supports that  can be monitored and strategies for assisting them in times of crisis.   Intervention effectiveness could not be measured, as Catherine Salas reported having technical issues around 10:50am and had to switch to using her phone for remainder of session.  Catherine Salas did not engage in discussion much on subject, but did note that her current stressors include family conflict, loneliness, money worries, and lacking confidence.    Assessment and Plan: Counselor recommends that Catherine Salas remain in IOP treatment to better manage mental health symptoms, ensure stability and pursue completion of treatment plan goals. Counselor recommends adherence to crisis/safety plan, taking medications as prescribed, and following up with medical professionals if any issues arise.    Follow Up Instructions: Counselor will send Webex link for session tomorrow.  Catherine Salas was advised to call back or seek an in-person evaluation if the symptoms worsen or if the condition fails to improve as anticipated.   Collaboration of Care:   Medication Management AEB Dr. Eliseo Gum or Hillery Jacks, NP                                          Case Manager AEB Jeri Modena, CNA   Patient/Guardian was advised Release of Information must be obtained prior to any record release in order to collaborate their care with an outside provider. Patient/Guardian was advised if they have not already done so to contact the registration department to sign all necessary forms in order for [place] to release information regarding their care.   Consent: Patient/Guardian gives verbal consent for treatment and assignment of benefits for services provided during this visit. Patient/Guardian expressed understanding and agreed to proceed.  I provided 180 minutes of non-face-to-face time during this encounter.   Noralee Stain, [place], LCAS 07/15/22

## 2022-07-15 NOTE — Patient Instructions (Addendum)
D:  Patient will complete virtual MH-IOP tomorrow (07-16-22).  A:  Follow up with Dr. Jerrel Ivory on 10-04-22 @ 2:20 pm *in person for the first visit @ [address]; [address] [place] and [employer] (therapist) until she transitions to another therapist.  In addition to the referrals pt has; case mgr mentioned [facility] and [facility].  Strongly encouraged support groups through [facility] [phone].  R:  Patient receptive.

## 2022-07-16 ENCOUNTER — Telehealth (HOSPITAL_COMMUNITY): Payer: Self-pay | Admitting: Psychiatry

## 2022-07-16 ENCOUNTER — Other Ambulatory Visit (HOSPITAL_COMMUNITY): Payer: 59

## 2022-07-16 ENCOUNTER — Other Ambulatory Visit (HOSPITAL_COMMUNITY): Payer: 59 | Admitting: Psychiatry

## 2022-07-19 ENCOUNTER — Other Ambulatory Visit (HOSPITAL_COMMUNITY): Payer: 59

## 2022-07-19 NOTE — Psych (Signed)
Virtual Visit via Video Note  I connected with Catherine Salas on 06/23/22 at  9:00 AM EDT by a video enabled telemedicine application and verified that I am speaking with the correct person using two identifiers.  Location: Patient: patient home Provider: clinical home office   I discussed the limitations of evaluation and management by telemedicine and the availability of in person appointments. The patient expressed understanding and agreed to proceed.  I discussed the assessment and treatment plan with the patient. The patient was provided an opportunity to ask questions and all were answered. The patient agreed with the plan and demonstrated an understanding of the instructions.   The patient was advised to call back or seek an in-person evaluation if the symptoms worsen or if the condition fails to improve as anticipated.  Pt was provided 240 minutes of non-face-to-face time during this encounter.   Donia Guiles, LCSW   [facility] PHP THERAPIST PROGRESS NOTE  Catherine Salas [id number]  Session Time: 9:00 - 10:00  Participation Level: Active  Behavioral Response: CasualAlertDepressed  Type of Therapy: Group Therapy  Treatment Goals addressed: Coping  Progress Towards Goals: Progressing  Interventions: CBT, DBT, Supportive, and Reframing  Summary: Catherine Salas is a 27 y.o. female who presents with depression and anxiety symptoms.  Clinician led check-in regarding current stressors and situation, and review of patient completed daily inventory. Clinician utilized active listening and empathetic response and validated patient emotions. Clinician facilitated processing group on pertinent issues.?    Therapist Response: Patient arrived within time allowed. Patient rates her mood at a 5 on a scale of 1-10 with 10 being best. Pt states she feels "frustrated." Pt states she slept 8 hours and ate 3x. Pt reports her short term disability company is causing inc stress and  frustration and impacting mood. Pt states most of her afternoon was taken up wit back and forths with the company and is still unresolved. Pt shares applying some anger management techniques discussed in group. Pt shares she had dinner with a friend. Pt identified experiencing passive SI in the afternoon and denies plan and intent.  Patient able to process. Patient engaged in discussion.           Session Time: 10:00 am - 11:00 am   Participation Level: Active   Behavioral Response: CasualAlertDepressed   Type of Therapy: Group Therapy   Treatment Goals addressed: Coping   Progress Towards Goals: Progressing   Interventions: CBT, DBT, Solution Focused, Strength-based, Supportive, and Reframing   Therapist Response: Cln led processing group for pt's current struggles. Group members shared stressors and provided support and feedback. Cln brought in topics of boundaries, healthy relationships, and unhealthy thought processes to inform discussion.    Therapist Response: Pt able to process and provide support to group.          Session Time: 11:00 -12:00   Participation Level: Active   Behavioral Response: CasualAlertDepressed   Type of Therapy: Group Therapy, Spiritual Care   Treatment Goals addressed: Coping   Progress Towards Goals: Progressing   Interventions: Supportive, Education   Summary:  Laurell Josephs, Chaplain, led group.   Therapist Response: Pt participated        Session Time: 12:00 -1:00   Participation Level: Active   Behavioral Response: CasualAlertDepressed   Type of Therapy: Group therapy, Occupational Therapy   Treatment Goals addressed: Coping   Progress Towards Goals: Progressing   Interventions: Supportive; Psychoeducation   Summary: 12:00 - 12:50: Occupational Therapy group  led by cln E. Hollan. 12:50 - 1:00 Clinician assessed for immediate needs, medication compliance and efficacy, and safety concerns.   Therapist Response: 12:00 -  12:50: See OT note 12:50 - 1:00 pm: At check-out, patient reports no immediate concerns. Patient demonstrates progress as evidenced by continued engagement and responsiveness to treatment. Patient denies SI/HI/self-harm thoughts at the end of group.    Suicidal/Homicidal: Nowithout intent/plan  Plan: Pt will continue in PHP while working to decrease depression and anxiety symptoms, increase emotion regulation, and increase ability to manage symptoms in a healthy manner.   Collaboration of Care: Medication Management AEB J. McQuilla  Patient/Guardian was advised Release of Information must be obtained prior to any record release in order to collaborate their care with an outside provider. Patient/Guardian was advised if they have not already done so to contact the registration department to sign all necessary forms in order for [place] to release information regarding their care.   Consent: Patient/Guardian gives verbal consent for treatment and assignment of benefits for services provided during this visit. Patient/Guardian expressed understanding and agreed to proceed.   Diagnosis: Severe episode of recurrent major depressive disorder, without psychotic features (HCC) [F33.2]    1. Severe episode of recurrent major depressive disorder, without psychotic features (HCC)   2. GAD (generalized anxiety disorder)       Donia Guiles, LCSW

## 2022-07-19 NOTE — Psych (Signed)
Virtual Visit via Video Note  I connected with Catherine Salas on 06/24/22 at  9:00 AM EDT by a video enabled telemedicine application and verified that I am speaking with the correct person using two identifiers.  Location: Patient: patient home Provider: clinical home office   I discussed the limitations of evaluation and management by telemedicine and the availability of in person appointments. The patient expressed understanding and agreed to proceed.  I discussed the assessment and treatment plan with the patient. The patient was provided an opportunity to ask questions and all were answered. The patient agreed with the plan and demonstrated an understanding of the instructions.   The patient was advised to call back or seek an in-person evaluation if the symptoms worsen or if the condition fails to improve as anticipated.  Pt was provided 240 minutes of non-face-to-face time during this encounter.   Donia Guiles, LCSW   [facility] PHP THERAPIST PROGRESS NOTE  Catherine Salas [id number]  Session Time: 9:00 - 10:00  Participation Level: Active  Behavioral Response: CasualAlertDepressed  Type of Therapy: Group Therapy  Treatment Goals addressed: Coping  Progress Towards Goals: Progressing  Interventions: CBT, DBT, Supportive, and Reframing  Summary: Catherine Salas is a 27 y.o. female who presents with depression and anxiety symptoms.  Clinician led check-in regarding current stressors and situation, and review of patient completed daily inventory. Clinician utilized active listening and empathetic response and validated patient emotions. Clinician facilitated processing group on pertinent issues.?    Therapist Response: Patient arrived within time allowed. Patient rates her mood at a 5 on a scale of 1-10 with 10 being best. Pt states she feels "frustrated." Pt states she slept 10 hours and ate 2x. Pt reports continued struggles with her short term disability company  and feeling "tired, broken down" by the ongoing process. Pt reports helping her mom, making dinner, and going to bed early to deal with her feelings. Patient able to process. Patient engaged in discussion.           Session Time: 10:00 am - 11:00 am   Participation Level: Active   Behavioral Response: CasualAlertDepressed   Type of Therapy: Group Therapy   Treatment Goals addressed: Coping   Progress Towards Goals: Progressing   Interventions: CBT, DBT, Solution Focused, Strength-based, Supportive, and Reframing   Therapist Response: Cln introduced CBT and the way in which it can provide context for addressing stumbling blocks. Group discussed "the problem is not the problem, the problem is how we're thinking about the problem" and tried to change perspective on current struggles.    Therapist Response:  Pt engaged in discussion and is able to attempt reframing using CBT.        Session Time: 11:00 -12:00   Participation Level: Active   Behavioral Response: CasualAlertDepressed   Type of Therapy: Group Therapy   Treatment Goals addressed: Coping   Progress Towards Goals: Progressing   Interventions: CBT, DBT, Solution Focused, Strength-based, Supportive, and Reframing   Summary: Cln continued topic of CBT cognitive distortions and utilized handout "Unhealthy Thought Patterns "to review common examples of distorted thought to increase awareness of the distorted thoughts.   Therapist Response:  Pt engaged in discussion and is able to make connections from her life to the distorted thoughts.          Session Time: 12:00 -1:00   Participation Level: Active   Behavioral Response: CasualAlertDepressed   Type of Therapy: Group therapy, Occupational Therapy   Treatment Goals  addressed: Coping   Progress Towards Goals: Progressing   Interventions: Supportive; Psychoeducation   Summary: 12:00 - 12:50: Occupational Therapy group led by cln E. Hollan. 12:50 - 1:00  Clinician assessed for immediate needs, medication compliance and efficacy, and safety concerns.   Therapist Response: 12:00 - 12:50: See OT note 12:50 - 1:00 pm: At check-out, patient reports no immediate concerns. Patient demonstrates progress as evidenced by continued engagement and responsiveness to treatment. Patient denies SI/HI/self-harm thoughts at the end of group.    Suicidal/Homicidal: Nowithout intent/plan  Plan: Pt will continue in PHP while working to decrease depression and anxiety symptoms, increase emotion regulation, and increase ability to manage symptoms in a healthy manner.   Collaboration of Care: Medication Management AEB J. McQuilla  Patient/Guardian was advised Release of Information must be obtained prior to any record release in order to collaborate their care with an outside provider. Patient/Guardian was advised if they have not already done so to contact the registration department to sign all necessary forms in order for [place] to release information regarding their care.   Consent: Patient/Guardian gives verbal consent for treatment and assignment of benefits for services provided during this visit. Patient/Guardian expressed understanding and agreed to proceed.   Diagnosis: Severe episode of recurrent major depressive disorder, without psychotic features (HCC) [F33.2]    1. Severe episode of recurrent major depressive disorder, without psychotic features (HCC)   2. GAD (generalized anxiety disorder)       Donia Guiles, LCSW

## 2022-07-19 NOTE — Psych (Signed)
Virtual Visit via Video Note  I connected with [name] on 06/22/22 at  9:00 AM EDT by a video enabled telemedicine application and verified that I am speaking with the correct person using two identifiers.  Location: Patient: patient home Provider: clinical home office   I discussed the limitations of evaluation and management by telemedicine and the availability of in person appointments. The patient expressed understanding and agreed to proceed.  I discussed the assessment and treatment plan with the patient. The patient was provided an opportunity to ask questions and all were answered. The patient agreed with the plan and demonstrated an understanding of the instructions.   The patient was advised to call back or seek an in-person evaluation if the symptoms worsen or if the condition fails to improve as anticipated.  Pt was provided 240 minutes of non-face-to-face time during this encounter.   Donia Guiles, LCSW   [facility] PHP THERAPIST PROGRESS NOTE  RANDYL LINWOOD [id number]  Session Time: 9:00 - 10:00  Participation Level: Active  Behavioral Response: CasualAlertDepressed  Type of Therapy: Group Therapy  Treatment Goals addressed: Coping  Progress Towards Goals: Progressing  Interventions: CBT, DBT, Supportive, and Reframing  Summary: NICOSHA BRIDWELL is a 27 y.o. female who presents with depression and anxiety symptoms.  Clinician led check-in regarding current stressors and situation, and review of patient completed daily inventory. Clinician utilized active listening and empathetic response and validated patient emotions. Clinician facilitated processing group on pertinent issues.?    Therapist Response: Patient arrived within time allowed. Patient rates her mood at a 6 on a scale of 1-10 with 10 being best. Pt states she feels "medium." Pt states she slept 8.5 hours and ate 3x. Pt reports she took her mom to the pool and had a good time. Pt reports  desire to resume swimming as it was previously a big part of her life. Pt reports frustration later in the evening due to having an "emotional crash" in which she felt irritable and angry. Pt shares history of dissociating when angry. Pt denies SI/HI.  Patient able to process. Patient engaged in discussion.            Session Time: 10:00 am - 11:00 am   Participation Level: Active   Behavioral Response: CasualAlertDepressed   Type of Therapy: Group Therapy   Treatment Goals addressed: Coping   Progress Towards Goals: Progressing   Interventions: CBT, DBT, Solution Focused, Strength-based, Supportive, and Reframing   Therapist Response: Cln led discussion on healthy aggression substitutes. Cln discussed the benefits to discharging energy and adrenaline when feeling "revved up" in emotion and the importance of balancing that discharge with safety and lack if negative consequences. Group brainstormed different ways to channel aggression in a healthy way and shared ways that have worked for them in the past.  Therapist Response:  Pt engaged in discussion and identifies 3 options to try.          Session Time: 11:00 -12:00   Participation Level: Active   Behavioral Response: CasualAlertDepressed   Type of Therapy: Group Therapy   Treatment Goals addressed: Coping   Progress Towards Goals: Progressing   Interventions: CBT, DBT, Solution Focused, Strength-based, Supportive, and Reframing   Summary: Cln led discussion on decision making and how to apply logic to fears. Group viewed TED talk "Why you should define your fears not your goals" to aid discussion. Group discussed ways to consider how we can address fears and make them manageable.  Therapist Response:  Pt engaged in discussion and practices decision making model with group.         Session Time: 12:00 -1:00   Participation Level: Active   Behavioral Response: CasualAlertDepressed   Type of Therapy: Group  therapy, Occupational Therapy   Treatment Goals addressed: Coping   Progress Towards Goals: Progressing   Interventions: Supportive; Psychoeducation   Summary: 12:00 - 12:50: Occupational Therapy group led by cln E. Hollan. 12:50 - 1:00 Clinician assessed for immediate needs, medication compliance and efficacy, and safety concerns.   Therapist Response: 12:00 - 12:50: See OT note 12:50 - 1:00 pm: At check-out, patient reports no immediate concerns. Patient demonstrates progress as evidenced by continued engagement and responsiveness to treatment. Patient denies SI/HI/self-harm thoughts at the end of group.    Suicidal/Homicidal: Nowithout intent/plan  Plan: Pt will continue in PHP while working to decrease depression and anxiety symptoms, increase emotion regulation, and increase ability to manage symptoms in a healthy manner.   Collaboration of Care: Medication Management AEB J. McQuilla  Patient/Guardian was advised Release of Information must be obtained prior to any record release in order to collaborate their care with an outside provider. Patient/Guardian was advised if they have not already done so to contact the registration department to sign all necessary forms in order for [place] to release information regarding their care.   Consent: Patient/Guardian gives verbal consent for treatment and assignment of benefits for services provided during this visit. Patient/Guardian expressed understanding and agreed to proceed.   Diagnosis: Severe episode of recurrent major depressive disorder, without psychotic features (HCC) [F33.2]    1. Severe episode of recurrent major depressive disorder, without psychotic features (HCC)   2. Mixed obsessional thoughts and acts   3. GAD (generalized anxiety disorder)       Donia Guiles, LCSW

## 2022-07-20 ENCOUNTER — Other Ambulatory Visit (HOSPITAL_COMMUNITY): Payer: 59

## 2022-07-20 NOTE — Psych (Signed)
Virtual Visit via Video Note  I connected with Catherine Salas on 06/29/22 at  9:00 AM EDT by a video enabled telemedicine application and verified that I am speaking with the correct person using two identifiers.  Location: Patient: patient home Provider: clinical home office   I discussed the limitations of evaluation and management by telemedicine and the availability of in person appointments. The patient expressed understanding and agreed to proceed.  I discussed the assessment and treatment plan with the patient. The patient was provided an opportunity to ask questions and all were answered. The patient agreed with the plan and demonstrated an understanding of the instructions.   The patient was advised to call back or seek an in-person evaluation if the symptoms worsen or if the condition fails to improve as anticipated.  Pt was provided 240 minutes of non-face-to-face time during this encounter.   Donia Guiles, LCSW   [facility] PHP THERAPIST PROGRESS NOTE  Catherine Salas [id number]  Session Time: 9:00 - 10:00  Participation Level: Active  Behavioral Response: CasualAlertDepressed  Type of Therapy: Group Therapy  Treatment Goals addressed: Coping  Progress Towards Goals: Progressing  Interventions: CBT, DBT, Supportive, and Reframing  Summary: Catherine Salas is a 27 y.o. female who presents with depression and anxiety symptoms.  Clinician led check-in regarding current stressors and situation, and review of patient completed daily inventory. Clinician utilized active listening and empathetic response and validated patient emotions. Clinician facilitated processing group on pertinent issues.?    Therapist Response: Patient arrived within time allowed. Patient rates her mood at a 6.5 on a scale of 1-10 with 10 being best. Pt states she feels "pretty good." Pt states she slept 8 hours and ate 2x. Pt reports two good nights of sleep in a row and not feeling groggy  for the first time in a while. Pt reports spending time writing a text to her recent ex to set a boundary of no contact for now. Pt reports anxiety about sending it and accepts group support. Pt states she left the house to do an errand and read some of a book. Patient able to process. Patient engaged in discussion.           Session Time: 10:00 am - 11:00 am   Participation Level: Active   Behavioral Response: CasualAlertDepressed   Type of Therapy: Group Therapy   Treatment Goals addressed: Coping   Progress Towards Goals: Progressing   Interventions: CBT, DBT, Solution Focused, Strength-based, Supportive, and Reframing   Therapist Response: Cln introduced grounding techniques as a coping strategy. Cln utilized handout "Detaching from emotional pain" from [employer]. Group reviewed grounding strategies and how they can apply them to their every day life and in which situations.    Therapist Response:  Pt engaged in discussion and is able to identify ways to utilize the techniques.        Session Time: 11:00 -12:00   Participation Level: Active   Behavioral Response: CasualAlertDepressed   Type of Therapy: Group Therapy   Treatment Goals addressed: Coping   Progress Towards Goals: Progressing   Interventions: CBT, DBT, Solution Focused, Strength-based, Supportive, and Reframing   Summary: Cln led discussion on how to fill unplanned time. Group members shared ways in which downtime negatively impacts mental health and often causes them to dwell on negative thinking. Group brainstormed ways to manage down time and problem solved how to handle barriers.    Therapist Response: Pt engaged in discussion.  Session Time: 12:00 -1:00   Participation Level: Active   Behavioral Response: CasualAlertDepressed   Type of Therapy: Group therapy, Occupational Therapy   Treatment Goals addressed: Coping   Progress Towards Goals: Progressing   Interventions:  Supportive; Psychoeducation   Summary: 12:00 - 12:50: Occupational Therapy group led by cln E. Hollan. 12:50 - 1:00 Clinician assessed for immediate needs, medication compliance and efficacy, and safety concerns.   Therapist Response: 12:00 - 12:50: See OT note 12:50 - 1:00 pm: At check-out, patient reports no immediate concerns. Patient demonstrates progress as evidenced by continued engagement and responsiveness to treatment. Patient denies SI/HI/self-harm thoughts at the end of group.    Suicidal/Homicidal: Nowithout intent/plan  Plan: Pt will continue in PHP while working to decrease depression and anxiety symptoms, increase emotion regulation, and increase ability to manage symptoms in a healthy manner.   Collaboration of Care: Medication Management [employer] J[employer] McQuilla  Patient/Guardian was advised Release of Information must be obtained prior to any record release in order to collaborate their care with an outside provider. Patient/Guardian was advised if they have not already done so to contact the registration department to sign all necessary forms in order for [place] to release information regarding their care.   Consent: Patient/Guardian gives verbal consent for treatment and assignment of benefits for services provided during this visit. Patient/Guardian expressed understanding and agreed to proceed.   Diagnosis: Severe episode of recurrent major depressive disorder, without psychotic features (HCC) [F33.2]    1. Severe episode of recurrent major depressive disorder, without psychotic features (HCC)   2. GAD (generalized anxiety disorder)       Donia Guiles, LCSW

## 2022-07-20 NOTE — Psych (Signed)
Virtual Visit via Video Note  I connected with Catherine Salas on 06/30/22 at  9:00 AM EDT by a video enabled telemedicine application and verified that I am speaking with the correct person using two identifiers.  Location: Patient: patient home Provider: clinical home office   I discussed the limitations of evaluation and management by telemedicine and the availability of in person appointments. The patient expressed understanding and agreed to proceed.  I discussed the assessment and treatment plan with the patient. The patient was provided an opportunity to ask questions and all were answered. The patient agreed with the plan and demonstrated an understanding of the instructions.   The patient was advised to call back or seek an in-person evaluation if the symptoms worsen or if the condition fails to improve as anticipated.  Pt was provided 240 minutes of non-face-to-face time during this encounter.   Donia Guiles, LCSW   [facility] PHP THERAPIST PROGRESS NOTE  Catherine Salas [id number]  Session Time: 9:00 - 10:00  Participation Level: Active  Behavioral Response: CasualAlertDepressed  Type of Therapy: Group Therapy  Treatment Goals addressed: Coping  Progress Towards Goals: Progressing  Interventions: CBT, DBT, Supportive, and Reframing  Summary: Catherine Salas is a 27 y.o. female who presents with depression and anxiety symptoms.  Clinician led check-in regarding current stressors and situation, and review of patient completed daily inventory. Clinician utilized active listening and empathetic response and validated patient emotions. Clinician facilitated processing group on pertinent issues.?    Therapist Response: Patient arrived within time allowed. Patient rates her mood at a 7 on a scale of 1-10 with 10 being best. Pt states she feels "less like the world is terrible." Pt states she slept 5 hours and ate 3x. Pt reports she thinks some medication changes are  helping and that her mom noticed improvement as well stating "it doesn't seem like a cloud is above you." Pt states she went swimming, had lunch, worked with dad around the house, and journaled. Pt reports anxiety that this increased mood will end again. Patient able to process. Patient engaged in discussion.           Session Time: 10:00 am - 11:00 am   Participation Level: Active   Behavioral Response: CasualAlertDepressed   Type of Therapy: Group Therapy   Treatment Goals addressed: Coping   Progress Towards Goals: Progressing   Interventions: CBT, DBT, Solution Focused, Strength-based, Supportive, and Reframing   Therapist Response: Cln led processing group for pt's current struggles. Group members shared stressors and provided support and feedback. Cln brought in topics of boundaries, healthy relationships, and unhealthy thought processes to inform discussion.    Therapist Response: Pt able to process and provide support to group.          Session Time: 11:00 -12:00   Participation Level: Active   Behavioral Response: CasualAlertDepressed   Type of Therapy: Group Therapy, Spiritual Care   Treatment Goals addressed: Coping   Progress Towards Goals: Progressing   Interventions: Supportive, Education   Summary:  Catherine Salas, Chaplain, led group.   Therapist Response: Pt participated        Session Time: 12:00 -1:00   Participation Level: Active   Behavioral Response: CasualAlertDepressed   Type of Therapy: Group therapy, Occupational Therapy   Treatment Goals addressed: Coping   Progress Towards Goals: Progressing   Interventions: Supportive; Psychoeducation   Summary: 12:00 - 12:50: Occupational Therapy group led by cln E. Hollan. 12:50 - 1:00 Clinician assessed for  immediate needs, medication compliance and efficacy, and safety concerns.   Therapist Response: 12:00 - 12:50: See OT note 12:50 - 1:00 pm: At check-out, patient reports no immediate  concerns. Patient demonstrates progress as evidenced by continued engagement and responsiveness to treatment. Patient denies SI/HI/self-harm thoughts at the end of group.    Suicidal/Homicidal: Nowithout intent/plan  Plan: Pt will continue in PHP while working to decrease depression and anxiety symptoms, increase emotion regulation, and increase ability to manage symptoms in a healthy manner.   Collaboration of Care: Medication Management [employer]  Patient/Guardian was advised Release of Information must be obtained prior to any record release in order to collaborate their care with an outside provider. Patient/Guardian was advised if they have not already done so to contact the registration department to sign all necessary forms in order for [place] to release information regarding their care.   Consent: Patient/Guardian gives verbal consent for treatment and assignment of benefits for services provided during this visit. Patient/Guardian expressed understanding and agreed to proceed.   Diagnosis: Severe episode of recurrent major depressive disorder, without psychotic features (HCC) [F33.2]    1. Severe episode of recurrent major depressive disorder, without psychotic features (HCC)   2. GAD (generalized anxiety disorder)       Donia Guiles, LCSW

## 2022-07-20 NOTE — Psych (Signed)
Virtual Visit via Video Note  I connected with [name] on 06/28/22 at  9:00 AM EDT by a video enabled telemedicine application and verified that I am speaking with the correct person using two identifiers.  Location: Patient: patient home Provider: clinical home office   I discussed the limitations of evaluation and management by telemedicine and the availability of in person appointments. The patient expressed understanding and agreed to proceed.  I discussed the assessment and treatment plan with the patient. The patient was provided an opportunity to ask questions and all were answered. The patient agreed with the plan and demonstrated an understanding of the instructions.   The patient was advised to call back or seek an in-person evaluation if the symptoms worsen or if the condition fails to improve as anticipated.  Pt was provided 240 minutes of non-face-to-face time during this encounter.   Donia Guiles, LCSW   [facility] PHP THERAPIST PROGRESS NOTE  ESTELLENE SANMIGUEL [id number]  Session Time: 9:00 - 10:00  Participation Level: Active  Behavioral Response: CasualAlertDepressed  Type of Therapy: Group Therapy  Treatment Goals addressed: Coping  Progress Towards Goals: Progressing  Interventions: CBT, DBT, Supportive, and Reframing  Summary: ADISYN KAMAN is a 27 y.o. female who presents with depression and anxiety symptoms.  Clinician led check-in regarding current stressors and situation, and review of patient completed daily inventory. Clinician utilized active listening and empathetic response and validated patient emotions. Clinician facilitated processing group on pertinent issues.?    Therapist Response: Patient arrived within time allowed. Patient rates her mood at a 7 on a scale of 1-10 with 10 being best. Pt states she feels "pretty good." Pt states she slept 8 hours and ate 2x. Pt states struggling with sleep Fri/Sat night due to forgetting to take  her sleep medication and having racing thoughts. Pt reports she left the house and saw friends twice this weekend. Pt reports still feeling increased effort and anxiety when social however thinks it is improving. Pt reports struggling with sending a "don't contact me again" text to her ex-boyfriend.  Patient able to process. Patient engaged in discussion.           Session Time: 10:00 am - 11:00 am   Participation Level: Active   Behavioral Response: CasualAlertDepressed   Type of Therapy: Group Therapy   Treatment Goals addressed: Coping   Progress Towards Goals: Progressing   Interventions: CBT, DBT, Solution Focused, Strength-based, Supportive, and Reframing   Therapist Response: Cln led discussion on deep breathing and its therapeutic benefits, using DBT TIPP skills to inform discussion. Group practiced how to breathe from their diaphragms to ensure therapeutic quality and different ways to keep track of regulating breaths.       Therapist Response: Pt engaged in discussion and practice.        Session Time: 11:00 -12:00   Participation Level: Active   Behavioral Response: CasualAlertDepressed   Type of Therapy: Group Therapy   Treatment Goals addressed: Coping   Progress Towards Goals: Progressing   Interventions: CBT, DBT, Solution Focused, Strength-based, Supportive, and Reframing   Summary: Cln led discussion on saying "no." Group members shared struggles they have with saying no and how it affects them. Cln utilized boundaries, communication, and self-esteem tenets to inform discussion.   Therapist Response:  Pt engaged in discussion and reports increased understanding of how to say "no."         Session Time: 12:00 -1:00   Participation Level: Active  Behavioral Response: CasualAlertDepressed   Type of Therapy: Group therapy, Occupational Therapy   Treatment Goals addressed: Coping   Progress Towards Goals: Progressing   Interventions: Supportive;  Psychoeducation   Summary: 12:00 - 12:50: Occupational Therapy group led by cln E. Hollan. 12:50 - 1:00 Clinician assessed for immediate needs, medication compliance and efficacy, and safety concerns.   Therapist Response: 12:00 - 12:50: See OT note 12:50 - 1:00 pm: At check-out, patient reports no immediate concerns. Patient demonstrates progress as evidenced by continued engagement and responsiveness to treatment. Patient denies SI/HI/self-harm thoughts at the end of group.    Suicidal/Homicidal: Nowithout intent/plan  Plan: Pt will continue in PHP while working to decrease depression and anxiety symptoms, increase emotion regulation, and increase ability to manage symptoms in a healthy manner.   Collaboration of Care: Medication Management [employer] McQuilla  Patient/Guardian was advised Release of Information must be obtained prior to any record release in order to collaborate their care with an outside provider. Patient/Guardian was advised if they have not already done so to contact the registration department to sign all necessary forms in order for [place] to release information regarding their care.   Consent: Patient/Guardian gives verbal consent for treatment and assignment of benefits for services provided during this visit. Patient/Guardian expressed understanding and agreed to proceed.   Diagnosis: Severe episode of recurrent major depressive disorder, without psychotic features (HCC) [F33.2]    1. Severe episode of recurrent major depressive disorder, without psychotic features (HCC)   2. GAD (generalized anxiety disorder)   3. Mixed obsessional thoughts and acts       [employer], [address]

## 2022-07-20 NOTE — Psych (Signed)
Virtual Visit via Video Note  I connected with [name] on 06/25/22 at  9:00 AM EDT by a video enabled telemedicine application and verified that I am speaking with the correct person using two identifiers.  Location: Patient: patient home Provider: clinical home office   I discussed the limitations of evaluation and management by telemedicine and the availability of in person appointments. The patient expressed understanding and agreed to proceed.  I discussed the assessment and treatment plan with the patient. The patient was provided an opportunity to ask questions and all were answered. The patient agreed with the plan and demonstrated an understanding of the instructions.   The patient was advised to call back or seek an in-person evaluation if the symptoms worsen or if the condition fails to improve as anticipated.  Pt was provided 240 minutes of non-face-to-face time during this encounter.   Donia Guiles, LCSW   [facility] PHP THERAPIST PROGRESS NOTE  ALLEGRA WARFEL [id number]  Session Time: 9:00 - 10:00  Participation Level: Active  Behavioral Response: CasualAlertDepressed  Type of Therapy: Group Therapy  Treatment Goals addressed: Coping  Progress Towards Goals: Progressing  Interventions: CBT, DBT, Supportive, and Reframing  Summary: MIRELY GALKIN is a 27 y.o. female who presents with depression and anxiety symptoms.  Clinician led check-in regarding current stressors and situation, and review of patient completed daily inventory. Clinician utilized active listening and empathetic response and validated patient emotions. Clinician facilitated processing group on pertinent issues.?    Therapist Response: Patient arrived within time allowed. Patient rates her mood at a 5 on a scale of 1-10 with 10 being best. Pt states she feels "anxious." Pt states she slept 10.5 hours and ate 2x. Pt reports she had a "big 'ol panic attack" last evening. Pt reports it  came on quickly and she spiraled re: the future ,work, and STD issues. Pt reports feeling frustrated of the cycle she is in. Pt reports utilizing cold water trick and her PRN medication to manage the panic attack.  Patient able to process. Patient engaged in discussion.           Session Time: 10:00 am - 11:00 am   Participation Level: Active   Behavioral Response: CasualAlertDepressed   Type of Therapy: Group Therapy   Treatment Goals addressed: Coping   Progress Towards Goals: Progressing   Interventions: CBT, DBT, Solution Focused, Strength-based, Supportive, and Reframing   Therapist Response: Cln led discussion on ways to manage stressors and feelings over the weekend. Group members  brainstormed things to do over the weekend for multiple levels of energy, access, and moods. Cln reviewed crisis services should they be needed and provided pt's with the text crisis line, mobile crisis, national suicide hotline, [facility] 24/7 line, and information on [facility].      Therapist Response: Pt engaged in discussion and is able to identify 3 ideas of what to do over the weekend to keep their mind engaged.        Session Time: 11:00 -12:00   Participation Level: Active   Behavioral Response: CasualAlertDepressed   Type of Therapy: Group Therapy   Treatment Goals addressed: Coping   Progress Towards Goals: Progressing   Interventions: CBT, DBT, Solution Focused, Strength-based, Supportive, and Reframing   Summary: Cln led discussion on CBT thinking error: mind reading. Cln worked with group members to identify examples of mind reading and the consequences that can come. Group shared ways in which mind reading has been an  issue for them and barriers to working on it.    Therapist Response:   Pt engaged in discussion and reports understanding of mind reading.        Session Time: 12:00 -1:00   Participation Level: Active   Behavioral Response: CasualAlertDepressed   Type  of Therapy: Group therapy, Occupational Therapy   Treatment Goals addressed: Coping   Progress Towards Goals: Progressing   Interventions: Supportive; Psychoeducation   Summary: 12:00 - 12:50: Occupational Therapy group led by cln E. Hollan. 12:50 - 1:00 Clinician assessed for immediate needs, medication compliance and efficacy, and safety concerns.   Therapist Response: 12:00 - 12:50: See OT note 12:50 - 1:00 pm: At check-out, patient reports no immediate concerns. Patient demonstrates progress as evidenced by continued engagement and responsiveness to treatment. Patient denies SI/HI/self-harm thoughts at the end of group.    Suicidal/Homicidal: Nowithout intent/plan  Plan: Pt will continue in PHP while working to decrease depression and anxiety symptoms, increase emotion regulation, and increase ability to manage symptoms in a healthy manner.   Collaboration of Care: Medication Management AEB J. McQuilla  Patient/Guardian was advised Release of Information must be obtained prior to any record release in order to collaborate their care with an outside provider. Patient/Guardian was advised if they have not already done so to contact the registration department to sign all necessary forms in order for [place] to release information regarding their care.   Consent: Patient/Guardian gives verbal consent for treatment and assignment of benefits for services provided during this visit. Patient/Guardian expressed understanding and agreed to proceed.   Diagnosis: Severe episode of recurrent major depressive disorder, without psychotic features (HCC) [F33.2]    1. Severe episode of recurrent major depressive disorder, without psychotic features (HCC)   2. GAD (generalized anxiety disorder)       Donia Guiles, LCSW

## 2022-07-20 NOTE — Psych (Signed)
Virtual Visit via Video Note  I connected with Catherine Salas on 07/01/22 at  9:00 AM EDT by a video enabled telemedicine application and verified that I am speaking with the correct person using two identifiers.  Location: Patient: patient home Provider: clinical home office   I discussed the limitations of evaluation and management by telemedicine and the availability of in person appointments. The patient expressed understanding and agreed to proceed.  I discussed the assessment and treatment plan with the patient. The patient was provided an opportunity to ask questions and all were answered. The patient agreed with the plan and demonstrated an understanding of the instructions.   The patient was advised to call back or seek an in-person evaluation if the symptoms worsen or if the condition fails to improve as anticipated.  Pt was provided 240 minutes of non-face-to-face time during this encounter.   Donia Guiles, LCSW   [facility] PHP THERAPIST PROGRESS NOTE  Catherine Salas [id number]  Session Time: 9:00 - 10:00  Participation Level: Active  Behavioral Response: CasualAlertDepressed  Type of Therapy: Group Therapy  Treatment Goals addressed: Coping  Progress Towards Goals: Progressing  Interventions: CBT, DBT, Supportive, and Reframing  Summary: Catherine Salas is a 27 y.o. female who presents with depression and anxiety symptoms.  Clinician led check-in regarding current stressors and situation, and review of patient completed daily inventory. Clinician utilized active listening and empathetic response and validated patient emotions. Clinician facilitated processing group on pertinent issues.?    Therapist Response: Patient arrived within time allowed. Patient rates her mood at a 7 on a scale of 1-10 with 10 being best. Pt states she feels "pretty good." Pt states she slept 7 hours and ate 3x. Pt reports feeling nervous about discharge but also capable. Pt states  helping mom yesterday and doing journaling.  Patient able to process. Patient engaged in discussion.           Session Time: 10:00 am - 11:00 am   Participation Level: Active   Behavioral Response: CasualAlertDepressed   Type of Therapy: Group Therapy   Treatment Goals addressed: Coping   Progress Towards Goals: Progressing   Interventions: CBT, DBT, Solution Focused, Strength-based, Supportive, and Reframing   Therapist Response: Cln continued topic of CBT cognitive distortions. Cln utilized handout "cognitive distortions" to discuss common examples of distorted thoughts and group members worked to identify examples in their own life.    Therapist Response: Pt engaged in discussion and identifies which are most problematic.        Session Time: 11:00 -12:00   Participation Level: Active   Behavioral Response: CasualAlertDepressed   Type of Therapy: Group Therapy   Treatment Goals addressed: Coping   Progress Towards Goals: Progressing   Interventions: CBT, DBT, Solution Focused, Strength-based, Supportive, and Reframing   Summary: Cln introduced DBT distress tolerance skill IMPROVE.  Cln discussed how this set of skills are for when you have to sit through an undesirable feeling and wait for it to pass. Group discussed how to apply the IMPROVE skills to decrease distress at the undesired feeling.   Therapist Response: Pt engaged in discussion and is able to identify ways to apply the skill.         Session Time: 12:00 -1:00   Participation Level: Active   Behavioral Response: CasualAlertDepressed   Type of Therapy: Group therapy, Occupational Therapy   Treatment Goals addressed: Coping   Progress Towards Goals: Progressing   Interventions: Supportive; Psychoeducation   Summary:  12:00 - 12:50: Occupational Therapy group led by cln E. Hollan. 12:50 - 1:00 Clinician assessed for immediate needs, medication compliance and efficacy, and safety concerns.    Therapist Response: 12:00 - 12:50: See OT note 12:50 - 1:00 pm: At check-out, patient reports no immediate concerns. Patient demonstrates progress as evidenced by continued engagement and responsiveness to treatment. Patient denies SI/HI/self-harm thoughts at the end of group.    Suicidal/Homicidal: Nowithout intent/plan  Plan: Pt will discharge from PHP due to meting treatment goals of decreased depression and anxiety symptoms, increased emotion regulation, and increased ability to manage symptoms in a healthy manner. Pt will step down to IOP within agency beginning 6/17. Pt and provider are aligned with discharge plan. Pt denies SI/HI at time of discharge.   Collaboration of Care: Medication Management AEB J. McQuilla  Patient/Guardian was advised Release of Information must be obtained prior to any record release in order to collaborate their care with an outside provider. Patient/Guardian was advised if they have not already done so to contact the registration department to sign all necessary forms in order for [place] to release information regarding their care.   Consent: Patient/Guardian gives verbal consent for treatment and assignment of benefits for services provided during this visit. Patient/Guardian expressed understanding and agreed to proceed.   Diagnosis: Severe episode of recurrent major depressive disorder, without psychotic features (HCC) [F33.2]    1. Severe episode of recurrent major depressive disorder, without psychotic features (HCC)   2. GAD (generalized anxiety disorder)       Donia Guiles, LCSW

## 2022-07-21 ENCOUNTER — Other Ambulatory Visit (HOSPITAL_COMMUNITY): Payer: 59

## 2022-07-23 ENCOUNTER — Other Ambulatory Visit (HOSPITAL_COMMUNITY): Payer: 59

## 2022-07-26 ENCOUNTER — Other Ambulatory Visit (HOSPITAL_COMMUNITY): Payer: 59

## 2022-07-27 ENCOUNTER — Other Ambulatory Visit (HOSPITAL_COMMUNITY): Payer: 59

## 2022-07-28 ENCOUNTER — Other Ambulatory Visit (HOSPITAL_COMMUNITY): Payer: 59

## 2022-07-29 ENCOUNTER — Other Ambulatory Visit (HOSPITAL_COMMUNITY): Payer: 59

## 2022-07-30 ENCOUNTER — Other Ambulatory Visit (HOSPITAL_COMMUNITY): Payer: 59

## 2022-08-02 ENCOUNTER — Other Ambulatory Visit (HOSPITAL_COMMUNITY): Payer: 59

## 2022-08-03 ENCOUNTER — Other Ambulatory Visit (HOSPITAL_COMMUNITY): Payer: 59

## 2022-08-04 ENCOUNTER — Other Ambulatory Visit (HOSPITAL_COMMUNITY): Payer: 59

## 2022-08-05 ENCOUNTER — Other Ambulatory Visit (HOSPITAL_COMMUNITY): Payer: 59

## 2022-08-06 ENCOUNTER — Other Ambulatory Visit (HOSPITAL_COMMUNITY): Payer: 59

## 2022-08-09 ENCOUNTER — Other Ambulatory Visit (HOSPITAL_COMMUNITY): Payer: 59

## 2022-08-10 ENCOUNTER — Other Ambulatory Visit (HOSPITAL_COMMUNITY): Payer: 59

## 2022-08-11 ENCOUNTER — Other Ambulatory Visit (HOSPITAL_COMMUNITY): Payer: 59

## 2022-08-12 ENCOUNTER — Other Ambulatory Visit (HOSPITAL_COMMUNITY): Payer: 59

## 2022-08-13 ENCOUNTER — Other Ambulatory Visit (HOSPITAL_COMMUNITY): Payer: 59

## 2022-08-16 ENCOUNTER — Ambulatory Visit (HOSPITAL_COMMUNITY): Payer: 59 | Admitting: Student

## 2022-08-16 ENCOUNTER — Other Ambulatory Visit (HOSPITAL_COMMUNITY): Payer: 59

## 2022-08-17 ENCOUNTER — Other Ambulatory Visit (HOSPITAL_COMMUNITY): Payer: 59

## 2022-08-18 ENCOUNTER — Other Ambulatory Visit (HOSPITAL_COMMUNITY): Payer: 59

## 2022-08-19 ENCOUNTER — Other Ambulatory Visit (HOSPITAL_COMMUNITY): Payer: 59

## 2022-08-20 ENCOUNTER — Other Ambulatory Visit (HOSPITAL_COMMUNITY): Payer: 59

## 2022-09-07 ENCOUNTER — Other Ambulatory Visit (HOSPITAL_COMMUNITY): Payer: Self-pay

## 2022-09-07 ENCOUNTER — Other Ambulatory Visit (HOSPITAL_COMMUNITY): Payer: Self-pay | Admitting: Psychiatry

## 2022-09-07 ENCOUNTER — Other Ambulatory Visit (HOSPITAL_COMMUNITY): Payer: Self-pay | Admitting: Family

## 2022-09-07 DIAGNOSIS — F411 Generalized anxiety disorder: Secondary | ICD-10-CM

## 2022-09-07 MED ORDER — FLUOXETINE HCL 10 MG PO CAPS: 30.0000 mg | ORAL_CAPSULE | Freq: Every day | ORAL | 0 refills | Status: DC

## 2022-09-07 MED ORDER — PROPRANOLOL HCL 10 MG PO TABS: 10.0000 mg | ORAL_TABLET | Freq: Two times a day (BID) | ORAL | 0 refills | Status: DC | PRN

## 2022-10-04 ENCOUNTER — Ambulatory Visit (HOSPITAL_COMMUNITY): Payer: 59 | Admitting: Student

## 2022-10-04 VITALS — BP 130/78 | Ht 67.0 in | Wt 281.0 lb

## 2022-10-04 DIAGNOSIS — F429 Obsessive-compulsive disorder, unspecified: Secondary | ICD-10-CM

## 2022-10-04 DIAGNOSIS — F4310 Post-traumatic stress disorder, unspecified: Secondary | ICD-10-CM | POA: Diagnosis not present

## 2022-10-04 DIAGNOSIS — F411 Generalized anxiety disorder: Secondary | ICD-10-CM | POA: Diagnosis not present

## 2022-10-04 DIAGNOSIS — F332 Major depressive disorder, recurrent severe without psychotic features: Secondary | ICD-10-CM | POA: Diagnosis not present

## 2022-10-04 MED ORDER — FLUOXETINE HCL 10 MG PO CAPS: 30.0000 mg | ORAL_CAPSULE | Freq: Every day | ORAL | 2 refills | Status: DC

## 2022-10-04 MED ORDER — PROPRANOLOL HCL 10 MG PO TABS: 10.0000 mg | ORAL_TABLET | Freq: Two times a day (BID) | ORAL | 2 refills | Status: DC | PRN

## 2022-10-04 NOTE — Addendum Note (Signed)
Addended by: Everlena Cooper on: 10/04/2022 04:22 PM   Modules accepted: Level of Service
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
## 2022-10-07 ENCOUNTER — Telehealth (HOSPITAL_COMMUNITY): Payer: Self-pay

## 2022-10-07 DIAGNOSIS — F4310 Post-traumatic stress disorder, unspecified: Secondary | ICD-10-CM

## 2022-10-07 DIAGNOSIS — F429 Obsessive-compulsive disorder, unspecified: Secondary | ICD-10-CM

## 2022-10-07 DIAGNOSIS — F332 Major depressive disorder, recurrent severe without psychotic features: Secondary | ICD-10-CM

## 2022-10-07 DIAGNOSIS — F411 Generalized anxiety disorder: Secondary | ICD-10-CM

## 2022-10-07 MED ORDER — FLUOXETINE HCL 40 MG PO CAPS: 40.0000 mg | ORAL_CAPSULE | Freq: Every morning | ORAL | 0 refills | Status: DC

## 2022-10-07 NOTE — Telephone Encounter (Signed)
This is a patient of Weston Brass, she called because she thought that Weston Brass was increasing her Prozac but when she picked it up it was the same dose. Please review and advise, thank you

## 2022-10-15 ENCOUNTER — Other Ambulatory Visit (HOSPITAL_COMMUNITY): Payer: Self-pay | Admitting: Psychiatry

## 2022-10-15 ENCOUNTER — Ambulatory Visit: Payer: Self-pay | Admitting: Family Medicine

## 2022-10-19 ENCOUNTER — Ambulatory Visit (HOSPITAL_COMMUNITY): Payer: 59 | Admitting: Licensed Clinical Social Worker

## 2022-10-26 ENCOUNTER — Ambulatory Visit (HOSPITAL_COMMUNITY): Payer: 59 | Admitting: Licensed Clinical Social Worker

## 2022-10-26 ENCOUNTER — Encounter (HOSPITAL_COMMUNITY): Payer: Self-pay | Admitting: Licensed Clinical Social Worker

## 2022-11-24 ENCOUNTER — Ambulatory Visit (HOSPITAL_BASED_OUTPATIENT_CLINIC_OR_DEPARTMENT_OTHER): Payer: 59 | Admitting: Student

## 2022-11-24 DIAGNOSIS — F4310 Post-traumatic stress disorder, unspecified: Secondary | ICD-10-CM | POA: Diagnosis not present

## 2022-11-24 DIAGNOSIS — F411 Generalized anxiety disorder: Secondary | ICD-10-CM | POA: Diagnosis not present

## 2022-11-24 DIAGNOSIS — F332 Major depressive disorder, recurrent severe without psychotic features: Secondary | ICD-10-CM | POA: Diagnosis not present

## 2022-11-24 DIAGNOSIS — F429 Obsessive-compulsive disorder, unspecified: Secondary | ICD-10-CM

## 2022-11-24 MED ORDER — PROPRANOLOL HCL 10 MG PO TABS: 20.0000 mg | ORAL_TABLET | Freq: Two times a day (BID) | ORAL | 2 refills | Status: DC | PRN

## 2022-11-24 MED ORDER — HYDROXYZINE HCL 10 MG PO TABS: 10.0000 mg | ORAL_TABLET | Freq: Every evening | ORAL | 2 refills | Status: DC | PRN

## 2022-11-24 MED ORDER — FLUOXETINE HCL 40 MG PO CAPS: 40.0000 mg | ORAL_CAPSULE | Freq: Every morning | ORAL | 2 refills | Status: DC
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
## 2022-12-01 NOTE — Addendum Note (Signed)
Addended by: Everlena Cooper on: 12/01/2022 10:19 AM   Modules accepted: Level of Service

## 2022-12-21 ENCOUNTER — Telehealth (HOSPITAL_COMMUNITY): Payer: Self-pay

## 2022-12-21 NOTE — Telephone Encounter (Signed)
Patient came in today, she lost her job and will lose her insurance this month, she would like a 90 day supply sent in so that she does not run out of medications. Please review and advise

## 2022-12-22 ENCOUNTER — Other Ambulatory Visit (HOSPITAL_COMMUNITY): Payer: Self-pay | Admitting: Student

## 2022-12-22 DIAGNOSIS — F429 Obsessive-compulsive disorder, unspecified: Secondary | ICD-10-CM

## 2022-12-22 DIAGNOSIS — F332 Major depressive disorder, recurrent severe without psychotic features: Secondary | ICD-10-CM

## 2022-12-22 DIAGNOSIS — F4310 Post-traumatic stress disorder, unspecified: Secondary | ICD-10-CM

## 2022-12-22 DIAGNOSIS — F411 Generalized anxiety disorder: Secondary | ICD-10-CM

## 2022-12-22 MED ORDER — PROPRANOLOL HCL 10 MG PO TABS: 20.0000 mg | ORAL_TABLET | Freq: Two times a day (BID) | ORAL | 2 refills | Status: AC | PRN

## 2022-12-22 MED ORDER — FLUOXETINE HCL 40 MG PO CAPS: 40.0000 mg | ORAL_CAPSULE | Freq: Every morning | ORAL | 1 refills | Status: AC

## 2022-12-22 MED ORDER — HYDROXYZINE HCL 10 MG PO TABS: 10.0000 mg | ORAL_TABLET | Freq: Every evening | ORAL | 1 refills | Status: AC | PRN

## 2022-12-24 ENCOUNTER — Telehealth (HOSPITAL_COMMUNITY): Payer: Self-pay | Admitting: Student

## 2022-12-24 NOTE — Telephone Encounter (Signed)
Patient called office to get in touch with Dr. Jerrel Ivory , said she is returning his call. Patient stated she is not sure what the call was for and thinks its in regards to her medication.Patient's next appointment is 01/24/2023-Please advise

## 2023-01-24 ENCOUNTER — Ambulatory Visit (HOSPITAL_COMMUNITY): Payer: 59 | Admitting: Student

## 2023-02-14 ENCOUNTER — Ambulatory Visit (HOSPITAL_COMMUNITY): Payer: 59 | Admitting: Student

## 2023-06-26 IMAGING — MR MR KNEE*R* W/O CM
6 series · 40 of 40 positions shown · non-contrast
Comparison: X-rays knee 11/11/2020; MR knee 01/10/2012.

CLINICAL DATA: Patient complains of medial right knee pain with
swelling and popping. She reports having a fall at age 14 injuring
the knee. Evaluate for meniscal tear.

EXAM:
MRI OF THE RIGHT KNEE WITHOUT CONTRAST
TECHNIQUE: Multiplanar, multisequence MR imaging of the knee was performed. No
intravenous contrast was administered.

[Series 6: T2 fat-sat · axial · right · 4.0mm · 0.50mm/px · z∈[-26,+127]mm · 9 of 36 slices shown (1 of 3)]
[im 1/36]
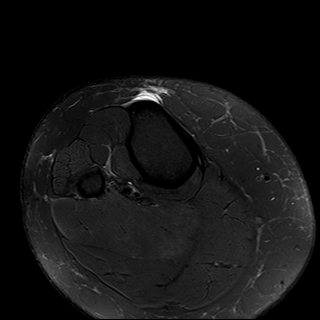
[im 5/36]
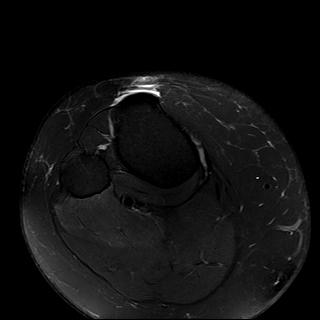
[im 9/36]
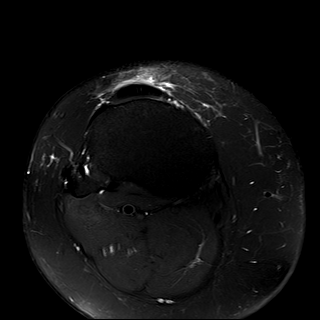
[im 14/36]
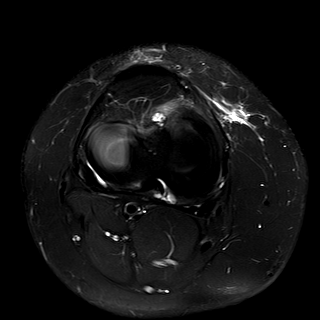
[im 18/36]
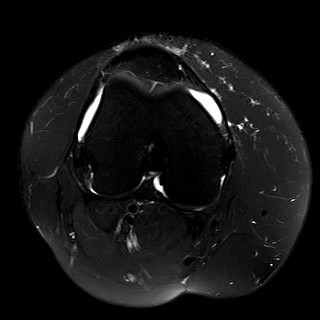
[im 22/36]
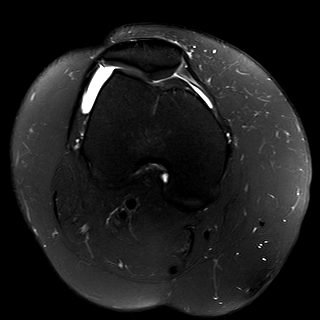
[im 27/36]
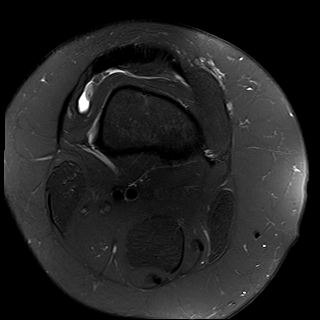
[im 31/36]
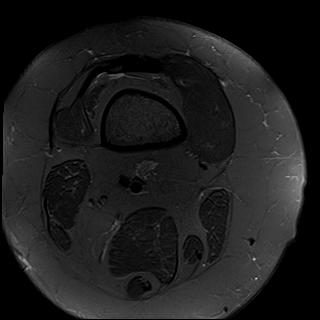
[im 36/36]
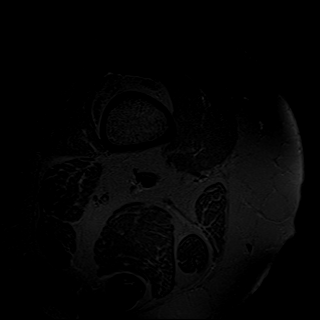

[Series 7: T2 fat-sat · coronal · right · 4.0mm · 0.47mm/px · 6 of 28 slices shown (2 of 3)]
[im 1/28]
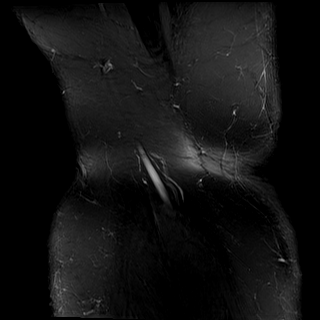
[im 6/28]
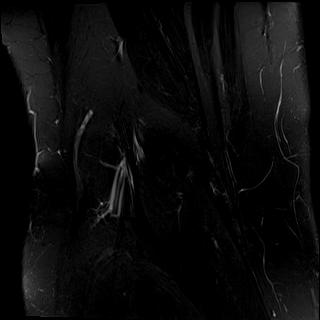
[im 11/28]
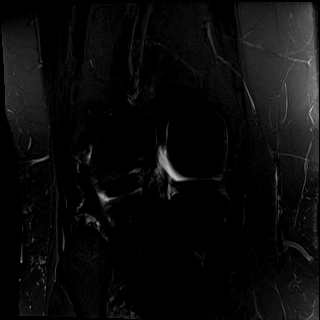
[im 17/28]
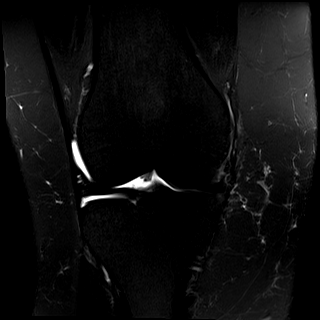
[im 22/28]
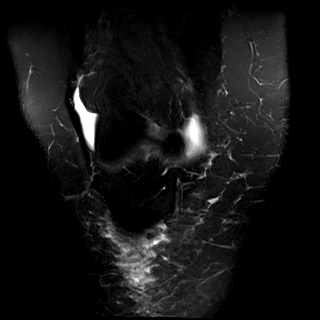
[im 28/28]
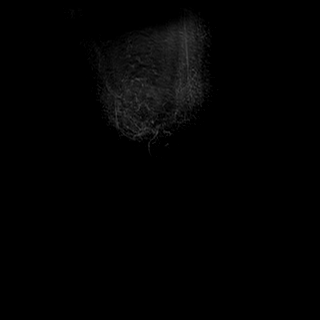

[Series 8: T1 · coronal · right · 4.0mm · 0.47mm/px · 6 of 28 slices shown]
[im 1/28]
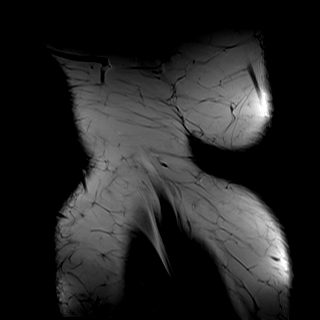
[im 6/28]
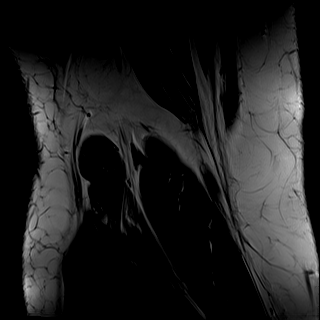
[im 11/28]
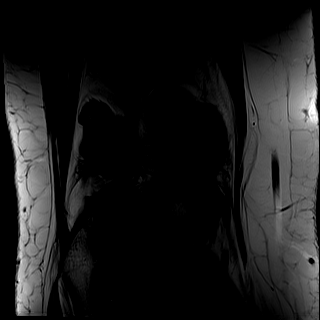
[im 17/28]
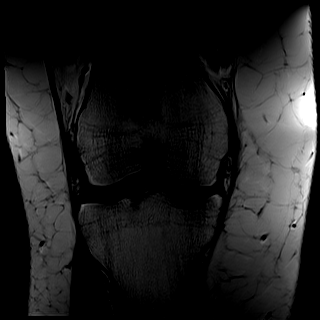
[im 22/28]
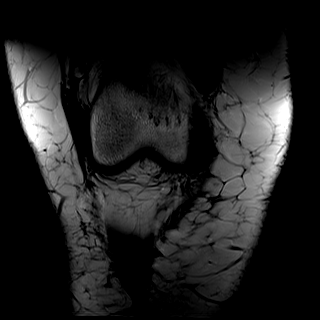
[im 28/28]
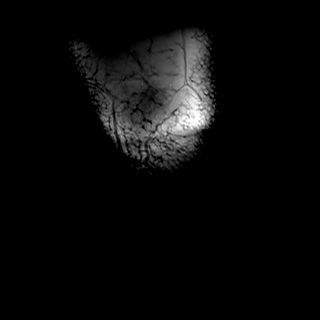

[Series 9: PD fat-sat · coronal · right · 3.0mm · 0.47mm/px · 7 of 32 slices shown (1 of 2)]
[im 1/32]
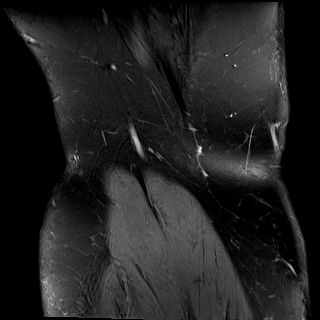
[im 6/32]
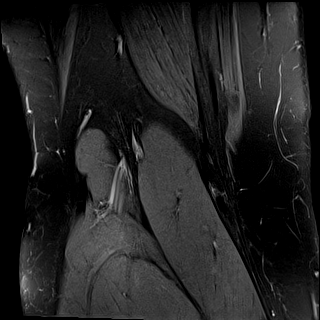
[im 11/32]
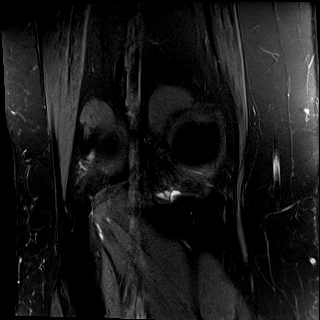
[im 16/32]
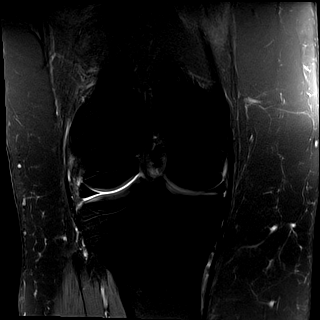
[im 21/32]
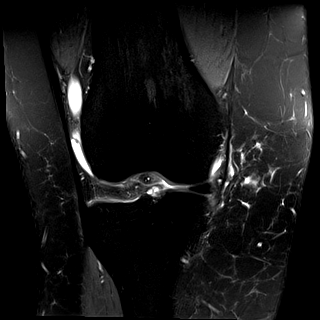
[im 26/32]
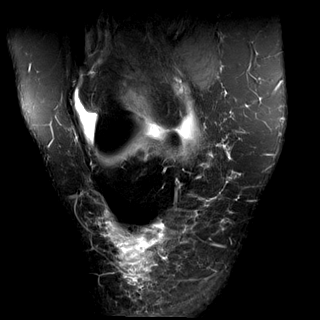
[im 32/32]
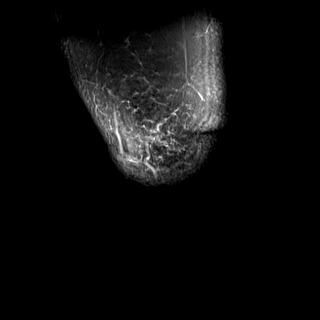

[Series 10: PD fat-sat · sagittal · right · 3.0mm · 0.39mm/px · 6 of 29 slices shown (2 of 2)]
[im 1/29]
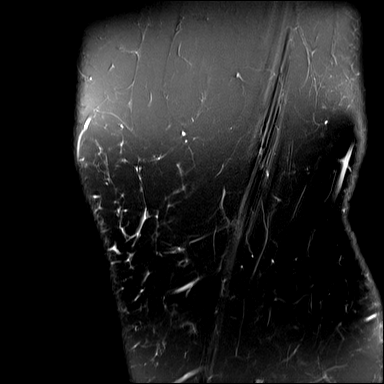
[im 6/29]
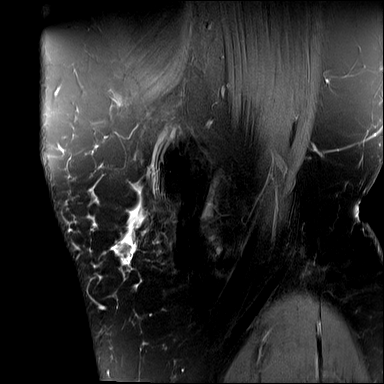
[im 12/29]
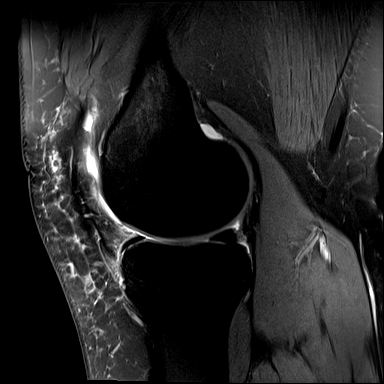
[im 17/29]
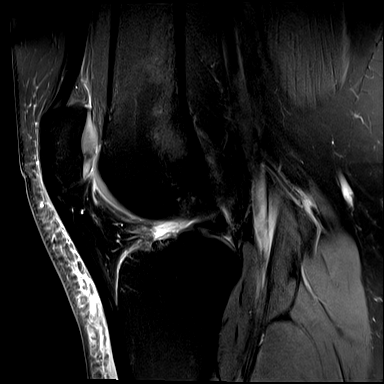
[im 23/29]
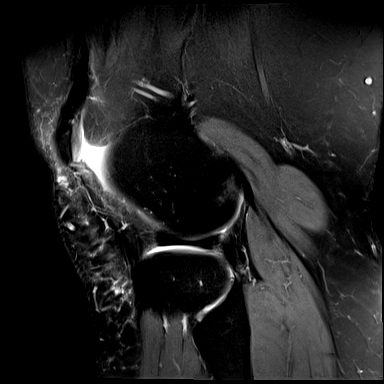
[im 29/29]
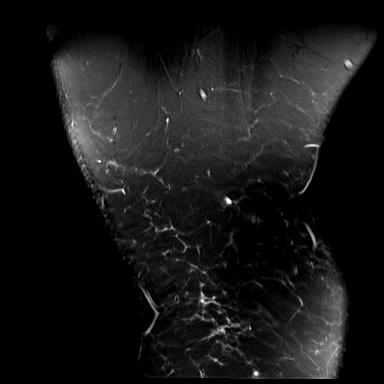

[Series 11: T2 fat-sat · sagittal · right · 3.0mm · 0.39mm/px · 6 of 29 slices shown (3 of 3)]
[im 1/29]
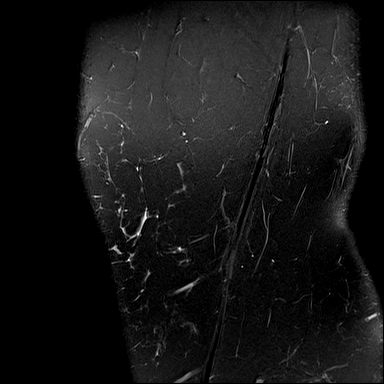
[im 6/29]
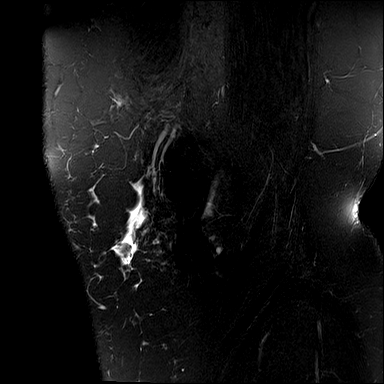
[im 12/29]
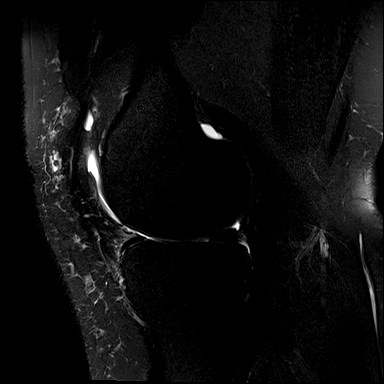
[im 17/29]
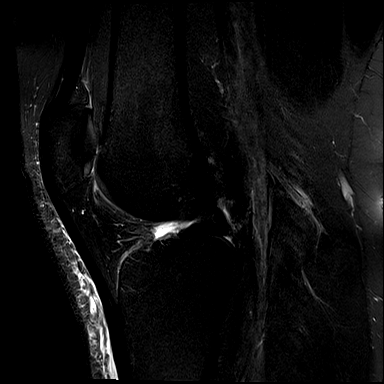
[im 23/29]
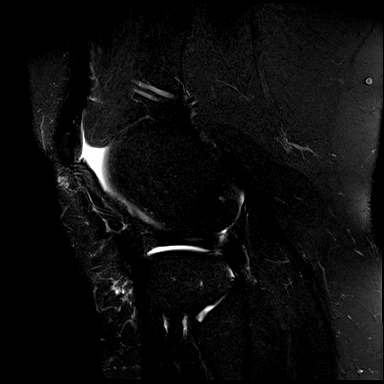
[im 29/29]
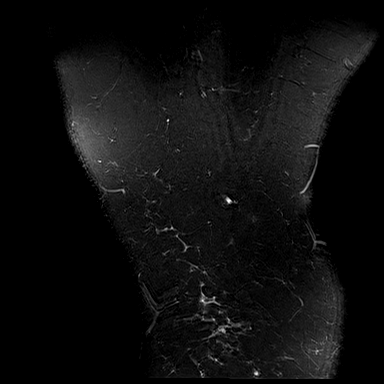

[40 of 40 positions shown; findings below may reference images not displayed]

FINDINGS: MENISCI

Medial meniscus:  Intact.

Lateral meniscus: Intact.

LIGAMENTS

Cruciates: Intact ACL and PCL.

Collaterals: Medial collateral ligament is intact. Lateral
collateral ligament complex is intact.

CARTILAGE

Patellofemoral:  Cartilage fissuring of the patellar apex.

Medial:  No chondral defect.

Lateral: No chondral defect.

Joint: Small joint effusion. Minimal edema in superolateral Hoffa's
fat. No plical thickening.

Popliteal Fossa: No Baker's cyst.  Intact popliteus tendon.

Extensor Mechanism: Intact quadriceps tendon and patellofemoral
tendon.

Bones: No focal marrow signal abnormality. No fracture or
dislocation.

Other: None.
IMPRESSION: 1. No meniscal or ligamentous injury of the right knee.
2. Cartilage fissuring of the patellar apex.
